# Patient Record
Sex: Male | Born: 1935 | Race: White | Hispanic: No | Marital: Single | Smoking: Former smoker
Health system: Southern US, Community
[De-identification: ages and names within clinical notes are randomized; demographics above are authoritative.]

## PROBLEM LIST (undated history)

## (undated) DIAGNOSIS — M1990 Unspecified osteoarthritis, unspecified site: Secondary | ICD-10-CM

## (undated) DIAGNOSIS — I10 Essential (primary) hypertension: Secondary | ICD-10-CM

## (undated) DIAGNOSIS — D649 Anemia, unspecified: Secondary | ICD-10-CM

## (undated) DIAGNOSIS — C61 Malignant neoplasm of prostate: Secondary | ICD-10-CM

## (undated) DIAGNOSIS — E785 Hyperlipidemia, unspecified: Secondary | ICD-10-CM

## (undated) DIAGNOSIS — C801 Malignant (primary) neoplasm, unspecified: Secondary | ICD-10-CM

## (undated) DIAGNOSIS — R001 Bradycardia, unspecified: Secondary | ICD-10-CM

## (undated) DIAGNOSIS — C50919 Malignant neoplasm of unspecified site of unspecified female breast: Secondary | ICD-10-CM

## (undated) DIAGNOSIS — N189 Chronic kidney disease, unspecified: Secondary | ICD-10-CM

## (undated) DIAGNOSIS — Z923 Personal history of irradiation: Secondary | ICD-10-CM

## (undated) DIAGNOSIS — I2510 Atherosclerotic heart disease of native coronary artery without angina pectoris: Secondary | ICD-10-CM

## (undated) HISTORY — DX: Anemia, unspecified: D64.9

## (undated) HISTORY — PX: MASTECTOMY: SHX3

## (undated) HISTORY — DX: Chronic kidney disease, unspecified: N18.9

## (undated) HISTORY — DX: Essential (primary) hypertension: I10

## (undated) HISTORY — DX: Malignant neoplasm of prostate: C61

## (undated) HISTORY — DX: Hyperlipidemia, unspecified: E78.5

## (undated) HISTORY — DX: Personal history of irradiation: Z92.3

## (undated) HISTORY — DX: Malignant neoplasm of unspecified site of unspecified female breast: C50.919

## (undated) HISTORY — DX: Unspecified osteoarthritis, unspecified site: M19.90

## (undated) HISTORY — DX: Bradycardia, unspecified: R00.1

## (undated) HISTORY — DX: Atherosclerotic heart disease of native coronary artery without angina pectoris: I25.10

---

## 1996-07-08 HISTORY — PX: CATARACT EXTRACTION: SUR2

## 1999-04-18 ENCOUNTER — Other Ambulatory Visit: Admission: RE | Admit: 1999-04-18 | Discharge: 1999-04-18 | Payer: Self-pay

## 2000-01-01 ENCOUNTER — Encounter: Admission: RE | Admit: 2000-01-01 | Discharge: 2000-01-01 | Payer: Self-pay | Admitting: Oncology

## 2000-01-01 ENCOUNTER — Encounter: Payer: Self-pay | Admitting: Oncology

## 2006-07-08 HISTORY — PX: CORONARY STENT PLACEMENT: SHX1402

## 2008-12-08 ENCOUNTER — Ambulatory Visit: Payer: Self-pay | Admitting: Oncology

## 2008-12-12 LAB — COMPREHENSIVE METABOLIC PANEL
Albumin: 3.7 g/dL (ref 3.5–5.2)
BUN: 31 mg/dL — ABNORMAL HIGH (ref 6–23)
Calcium: 9.1 mg/dL (ref 8.4–10.5)
Chloride: 108 mEq/L (ref 96–112)
Creatinine, Ser: 1.38 mg/dL (ref 0.40–1.50)
Glucose, Bld: 100 mg/dL — ABNORMAL HIGH (ref 70–99)
Potassium: 4.7 mEq/L (ref 3.5–5.3)

## 2008-12-12 LAB — CBC WITH DIFFERENTIAL/PLATELET
Basophils Absolute: 0 10*3/uL (ref 0.0–0.1)
Eosinophils Absolute: 0.2 10*3/uL (ref 0.0–0.5)
HCT: 32.5 % — ABNORMAL LOW (ref 38.4–49.9)
HGB: 10.7 g/dL — ABNORMAL LOW (ref 13.0–17.1)
MCH: 30.7 pg (ref 27.2–33.4)
MCV: 93.1 fL (ref 79.3–98.0)
MONO%: 9 % (ref 0.0–14.0)
NEUT#: 5.1 10*3/uL (ref 1.5–6.5)
NEUT%: 66.6 % (ref 39.0–75.0)
RDW: 12.6 % (ref 11.0–14.6)
lymph#: 1.6 10*3/uL (ref 0.9–3.3)

## 2008-12-13 LAB — CANCER ANTIGEN 27.29: CA 27.29: 12 U/mL (ref 0–39)

## 2008-12-15 ENCOUNTER — Ambulatory Visit (HOSPITAL_COMMUNITY): Admission: RE | Admit: 2008-12-15 | Discharge: 2008-12-15 | Payer: Self-pay | Admitting: Oncology

## 2008-12-21 DIAGNOSIS — D649 Anemia, unspecified: Secondary | ICD-10-CM

## 2008-12-21 HISTORY — DX: Anemia, unspecified: D64.9

## 2009-01-12 ENCOUNTER — Ambulatory Visit: Payer: Self-pay | Admitting: Oncology

## 2009-02-13 ENCOUNTER — Ambulatory Visit: Payer: Self-pay | Admitting: Oncology

## 2009-04-13 ENCOUNTER — Ambulatory Visit: Payer: Self-pay | Admitting: Oncology

## 2009-07-14 ENCOUNTER — Ambulatory Visit: Payer: Self-pay | Admitting: Oncology

## 2009-11-22 ENCOUNTER — Ambulatory Visit: Payer: Self-pay | Admitting: Oncology

## 2010-02-28 ENCOUNTER — Ambulatory Visit: Payer: Self-pay | Admitting: Oncology

## 2010-03-02 LAB — COMPREHENSIVE METABOLIC PANEL
ALT: 14 U/L (ref 0–53)
AST: 17 U/L (ref 0–37)
Albumin: 4.1 g/dL (ref 3.5–5.2)
Alkaline Phosphatase: 53 U/L (ref 39–117)
BUN: 40 mg/dL — ABNORMAL HIGH (ref 6–23)
CO2: 25 mEq/L (ref 19–32)
Calcium: 9.3 mg/dL (ref 8.4–10.5)
Chloride: 106 mEq/L (ref 96–112)
Creatinine, Ser: 1.73 mg/dL — ABNORMAL HIGH (ref 0.40–1.50)
Glucose, Bld: 97 mg/dL (ref 70–99)
Potassium: 4.6 mEq/L (ref 3.5–5.3)
Sodium: 140 mEq/L (ref 135–145)
Total Bilirubin: 0.4 mg/dL (ref 0.3–1.2)
Total Protein: 6.6 g/dL (ref 6.0–8.3)

## 2010-03-02 LAB — CBC WITH DIFFERENTIAL/PLATELET
BASO%: 0.7 % (ref 0.0–2.0)
Basophils Absolute: 0 10*3/uL (ref 0.0–0.1)
EOS%: 3.1 % (ref 0.0–7.0)
Eosinophils Absolute: 0.2 10*3/uL (ref 0.0–0.5)
HCT: 32.2 % — ABNORMAL LOW (ref 38.4–49.9)
HGB: 10.6 g/dL — ABNORMAL LOW (ref 13.0–17.1)
LYMPH%: 27.4 % (ref 14.0–49.0)
MCH: 31.5 pg (ref 27.2–33.4)
MCHC: 32.9 g/dL (ref 32.0–36.0)
MCV: 95.5 fL (ref 79.3–98.0)
MONO#: 0.7 10*3/uL (ref 0.1–0.9)
MONO%: 11.2 % (ref 0.0–14.0)
NEUT#: 3.4 10*3/uL (ref 1.5–6.5)
NEUT%: 57.6 % (ref 39.0–75.0)
Platelets: 165 10*3/uL (ref 140–400)
RBC: 3.37 10*6/uL — ABNORMAL LOW (ref 4.20–5.82)
RDW: 12.7 % (ref 11.0–14.6)
WBC: 5.8 10*3/uL (ref 4.0–10.3)
lymph#: 1.6 10*3/uL (ref 0.9–3.3)

## 2010-05-24 ENCOUNTER — Ambulatory Visit: Payer: Self-pay | Admitting: Oncology

## 2010-06-19 ENCOUNTER — Ambulatory Visit (HOSPITAL_COMMUNITY)
Admission: RE | Admit: 2010-06-19 | Discharge: 2010-06-19 | Payer: Self-pay | Source: Home / Self Care | Attending: Oncology | Admitting: Oncology

## 2010-07-29 ENCOUNTER — Encounter: Payer: Self-pay | Admitting: Oncology

## 2010-09-25 ENCOUNTER — Other Ambulatory Visit: Payer: Self-pay | Admitting: Oncology

## 2010-09-25 ENCOUNTER — Encounter (HOSPITAL_BASED_OUTPATIENT_CLINIC_OR_DEPARTMENT_OTHER): Payer: Medicare Other | Admitting: Oncology

## 2010-09-25 DIAGNOSIS — Z853 Personal history of malignant neoplasm of breast: Secondary | ICD-10-CM

## 2010-09-25 DIAGNOSIS — C50929 Malignant neoplasm of unspecified site of unspecified male breast: Secondary | ICD-10-CM

## 2010-09-25 DIAGNOSIS — N189 Chronic kidney disease, unspecified: Secondary | ICD-10-CM

## 2010-10-03 ENCOUNTER — Ambulatory Visit (HOSPITAL_COMMUNITY)
Admission: RE | Admit: 2010-10-03 | Discharge: 2010-10-03 | Disposition: A | Discharge status: Home / Self Care | Payer: Medicare Other | Source: Ambulatory Visit | Attending: Oncology | Admitting: Oncology

## 2010-10-03 ENCOUNTER — Encounter (HOSPITAL_COMMUNITY)
Admission: RE | Admit: 2010-10-03 | Discharge: 2010-10-03 | Disposition: A | Discharge status: Home / Self Care | Payer: Medicare Other | Source: Ambulatory Visit | Attending: Oncology | Admitting: Oncology

## 2010-10-03 ENCOUNTER — Other Ambulatory Visit: Payer: Self-pay | Admitting: Oncology

## 2010-10-03 ENCOUNTER — Encounter (HOSPITAL_COMMUNITY): Payer: Self-pay

## 2010-10-03 ENCOUNTER — Ambulatory Visit (HOSPITAL_COMMUNITY): Payer: BLUE CROSS/BLUE SHIELD

## 2010-10-03 DIAGNOSIS — Z853 Personal history of malignant neoplasm of breast: Secondary | ICD-10-CM | POA: Insufficient documentation

## 2010-10-03 DIAGNOSIS — C50919 Malignant neoplasm of unspecified site of unspecified female breast: Secondary | ICD-10-CM

## 2010-10-03 DIAGNOSIS — IMO0001 Reserved for inherently not codable concepts without codable children: Secondary | ICD-10-CM | POA: Insufficient documentation

## 2010-10-03 DIAGNOSIS — M25559 Pain in unspecified hip: Secondary | ICD-10-CM | POA: Insufficient documentation

## 2010-10-03 DIAGNOSIS — M79609 Pain in unspecified limb: Secondary | ICD-10-CM | POA: Insufficient documentation

## 2010-10-03 DIAGNOSIS — Q762 Congenital spondylolisthesis: Secondary | ICD-10-CM | POA: Insufficient documentation

## 2010-10-03 DIAGNOSIS — M1990 Unspecified osteoarthritis, unspecified site: Secondary | ICD-10-CM | POA: Insufficient documentation

## 2010-10-03 HISTORY — DX: Malignant (primary) neoplasm, unspecified: C80.1

## 2010-10-03 MED ORDER — TECHNETIUM TC 99M MEDRONATE IV KIT
23.1000 | PACK | Freq: Once | INTRAVENOUS | Status: AC | PRN
  Administered 2010-10-03: 23.1 via INTRAVENOUS

## 2010-10-05 ENCOUNTER — Encounter (HOSPITAL_BASED_OUTPATIENT_CLINIC_OR_DEPARTMENT_OTHER): Payer: Medicare Other | Admitting: Oncology

## 2010-10-05 DIAGNOSIS — C50929 Malignant neoplasm of unspecified site of unspecified male breast: Secondary | ICD-10-CM

## 2010-10-05 DIAGNOSIS — N189 Chronic kidney disease, unspecified: Secondary | ICD-10-CM

## 2010-10-15 LAB — GLUCOSE, CAPILLARY: Glucose-Capillary: 103 mg/dL — ABNORMAL HIGH (ref 70–99)

## 2010-11-02 ENCOUNTER — Encounter (HOSPITAL_BASED_OUTPATIENT_CLINIC_OR_DEPARTMENT_OTHER): Payer: Medicare Other | Admitting: Oncology

## 2010-11-02 DIAGNOSIS — Z9010 Acquired absence of unspecified breast and nipple: Secondary | ICD-10-CM

## 2010-11-02 DIAGNOSIS — Z853 Personal history of malignant neoplasm of breast: Secondary | ICD-10-CM

## 2010-11-02 DIAGNOSIS — C771 Secondary and unspecified malignant neoplasm of intrathoracic lymph nodes: Secondary | ICD-10-CM

## 2010-11-02 DIAGNOSIS — C779 Secondary and unspecified malignant neoplasm of lymph node, unspecified: Secondary | ICD-10-CM

## 2010-12-06 ENCOUNTER — Encounter (HOSPITAL_BASED_OUTPATIENT_CLINIC_OR_DEPARTMENT_OTHER): Payer: Medicare Other | Admitting: Oncology

## 2010-12-06 DIAGNOSIS — C771 Secondary and unspecified malignant neoplasm of intrathoracic lymph nodes: Secondary | ICD-10-CM

## 2010-12-06 DIAGNOSIS — C773 Secondary and unspecified malignant neoplasm of axilla and upper limb lymph nodes: Secondary | ICD-10-CM

## 2010-12-06 DIAGNOSIS — Z853 Personal history of malignant neoplasm of breast: Secondary | ICD-10-CM

## 2010-12-06 DIAGNOSIS — C779 Secondary and unspecified malignant neoplasm of lymph node, unspecified: Secondary | ICD-10-CM

## 2011-01-21 ENCOUNTER — Encounter (HOSPITAL_BASED_OUTPATIENT_CLINIC_OR_DEPARTMENT_OTHER): Payer: Medicare Other | Admitting: Oncology

## 2011-01-21 ENCOUNTER — Other Ambulatory Visit: Payer: Self-pay | Admitting: Oncology

## 2011-01-21 DIAGNOSIS — C50929 Malignant neoplasm of unspecified site of unspecified male breast: Secondary | ICD-10-CM

## 2011-01-21 DIAGNOSIS — C773 Secondary and unspecified malignant neoplasm of axilla and upper limb lymph nodes: Secondary | ICD-10-CM

## 2011-01-21 DIAGNOSIS — Z853 Personal history of malignant neoplasm of breast: Secondary | ICD-10-CM

## 2011-01-29 ENCOUNTER — Ambulatory Visit (HOSPITAL_COMMUNITY)
Admission: RE | Admit: 2011-01-29 | Discharge: 2011-01-29 | Disposition: A | Discharge status: Home / Self Care | Payer: Medicare Other | Source: Ambulatory Visit | Attending: Oncology | Admitting: Oncology

## 2011-01-29 DIAGNOSIS — J479 Bronchiectasis, uncomplicated: Secondary | ICD-10-CM | POA: Insufficient documentation

## 2011-01-29 DIAGNOSIS — C50929 Malignant neoplasm of unspecified site of unspecified male breast: Secondary | ICD-10-CM | POA: Insufficient documentation

## 2011-01-29 DIAGNOSIS — J438 Other emphysema: Secondary | ICD-10-CM | POA: Insufficient documentation

## 2011-01-29 DIAGNOSIS — I2510 Atherosclerotic heart disease of native coronary artery without angina pectoris: Secondary | ICD-10-CM | POA: Insufficient documentation

## 2011-01-29 DIAGNOSIS — Z853 Personal history of malignant neoplasm of breast: Secondary | ICD-10-CM

## 2011-01-29 DIAGNOSIS — R0602 Shortness of breath: Secondary | ICD-10-CM | POA: Insufficient documentation

## 2011-01-29 DIAGNOSIS — R911 Solitary pulmonary nodule: Secondary | ICD-10-CM | POA: Insufficient documentation

## 2011-01-29 DIAGNOSIS — Z9010 Acquired absence of unspecified breast and nipple: Secondary | ICD-10-CM | POA: Insufficient documentation

## 2011-01-29 DIAGNOSIS — J984 Other disorders of lung: Secondary | ICD-10-CM | POA: Insufficient documentation

## 2011-01-29 DIAGNOSIS — R05 Cough: Secondary | ICD-10-CM | POA: Insufficient documentation

## 2011-01-29 DIAGNOSIS — R059 Cough, unspecified: Secondary | ICD-10-CM | POA: Insufficient documentation

## 2011-02-06 ENCOUNTER — Other Ambulatory Visit: Payer: Self-pay | Admitting: Oncology

## 2011-02-06 ENCOUNTER — Encounter (HOSPITAL_BASED_OUTPATIENT_CLINIC_OR_DEPARTMENT_OTHER): Payer: Medicare Other | Admitting: Oncology

## 2011-02-06 DIAGNOSIS — Z853 Personal history of malignant neoplasm of breast: Secondary | ICD-10-CM

## 2011-02-06 DIAGNOSIS — C771 Secondary and unspecified malignant neoplasm of intrathoracic lymph nodes: Secondary | ICD-10-CM

## 2011-02-06 DIAGNOSIS — C779 Secondary and unspecified malignant neoplasm of lymph node, unspecified: Secondary | ICD-10-CM

## 2011-02-06 DIAGNOSIS — C773 Secondary and unspecified malignant neoplasm of axilla and upper limb lymph nodes: Secondary | ICD-10-CM

## 2011-02-06 DIAGNOSIS — C50919 Malignant neoplasm of unspecified site of unspecified female breast: Secondary | ICD-10-CM

## 2011-02-06 DIAGNOSIS — C50929 Malignant neoplasm of unspecified site of unspecified male breast: Secondary | ICD-10-CM

## 2011-02-06 LAB — CBC WITH DIFFERENTIAL/PLATELET
BASO%: 0.2 % (ref 0.0–2.0)
EOS%: 5.8 % (ref 0.0–7.0)
HCT: 31.9 % — ABNORMAL LOW (ref 38.4–49.9)
MCH: 32.5 pg (ref 27.2–33.4)
MCHC: 34.6 g/dL (ref 32.0–36.0)
MONO#: 0.7 10*3/uL (ref 0.1–0.9)
NEUT%: 55.3 % (ref 39.0–75.0)
RBC: 3.4 10*6/uL — ABNORMAL LOW (ref 4.20–5.82)
RDW: 13.2 % (ref 11.0–14.6)
WBC: 6.4 10*3/uL (ref 4.0–10.3)
lymph#: 1.8 10*3/uL (ref 0.9–3.3)

## 2011-02-06 LAB — BASIC METABOLIC PANEL
CO2: 21 mEq/L (ref 19–32)
Chloride: 109 mEq/L (ref 96–112)
Creatinine, Ser: 1.49 mg/dL — ABNORMAL HIGH (ref 0.50–1.35)
Potassium: 4 mEq/L (ref 3.5–5.3)
Sodium: 140 mEq/L (ref 135–145)

## 2011-04-05 ENCOUNTER — Other Ambulatory Visit: Payer: Self-pay | Admitting: Oncology

## 2011-04-05 ENCOUNTER — Encounter (HOSPITAL_BASED_OUTPATIENT_CLINIC_OR_DEPARTMENT_OTHER): Payer: Medicare Other | Admitting: Oncology

## 2011-04-05 DIAGNOSIS — Z853 Personal history of malignant neoplasm of breast: Secondary | ICD-10-CM

## 2011-04-05 DIAGNOSIS — C773 Secondary and unspecified malignant neoplasm of axilla and upper limb lymph nodes: Secondary | ICD-10-CM

## 2011-04-05 DIAGNOSIS — C771 Secondary and unspecified malignant neoplasm of intrathoracic lymph nodes: Secondary | ICD-10-CM

## 2011-04-05 DIAGNOSIS — C50919 Malignant neoplasm of unspecified site of unspecified female breast: Secondary | ICD-10-CM

## 2011-04-05 LAB — CBC WITH DIFFERENTIAL/PLATELET
BASO%: 0.4 % (ref 0.0–2.0)
MCHC: 34.3 g/dL (ref 32.0–36.0)
MONO#: 0.8 10*3/uL (ref 0.1–0.9)
NEUT#: 4.4 10*3/uL (ref 1.5–6.5)
RBC: 3.53 10*6/uL — ABNORMAL LOW (ref 4.20–5.82)
RDW: 13.1 % (ref 11.0–14.6)
WBC: 7.6 10*3/uL (ref 4.0–10.3)
lymph#: 2.1 10*3/uL (ref 0.9–3.3)

## 2011-06-28 ENCOUNTER — Telehealth: Payer: Self-pay | Admitting: Oncology

## 2011-06-28 ENCOUNTER — Ambulatory Visit (HOSPITAL_BASED_OUTPATIENT_CLINIC_OR_DEPARTMENT_OTHER): Payer: Medicare Other | Admitting: Oncology

## 2011-06-28 VITALS — BP 159/70 | HR 54 | Temp 98.0°F | Wt 140.1 lb

## 2011-06-28 DIAGNOSIS — Z170 Estrogen receptor positive status [ER+]: Secondary | ICD-10-CM

## 2011-06-28 DIAGNOSIS — C779 Secondary and unspecified malignant neoplasm of lymph node, unspecified: Secondary | ICD-10-CM

## 2011-06-28 DIAGNOSIS — C771 Secondary and unspecified malignant neoplasm of intrathoracic lymph nodes: Secondary | ICD-10-CM

## 2011-06-28 DIAGNOSIS — C50919 Malignant neoplasm of unspecified site of unspecified female breast: Secondary | ICD-10-CM

## 2011-06-28 DIAGNOSIS — C50929 Malignant neoplasm of unspecified site of unspecified male breast: Secondary | ICD-10-CM

## 2011-06-28 NOTE — Telephone Encounter (Signed)
appt made and printed for pt for 1/25 and rad onc     aom

## 2011-06-28 NOTE — Progress Notes (Signed)
OFFICE PROGRESS NOTE   INTERVAL HISTORY:   He returns as scheduled. He has no new complaint. He reports stable exertional dyspnea. He continues Megace. The right axillary/chest wall mass is "coming out of the skin".  Objective:  Vital signs in last 24 hours:  Blood pressure 159/70, pulse 54, temperature 98 F (36.7 C), temperature source Oral, weight 140 lb 1.6 oz (63.549 kg).    HEENT: Neck without mass Lymphatics: No cervical, clavicular, or axillary nodes Resp: Inspiratory rhonchi at the low posterior chest bilaterally. No respiratory distress Cardio: Regular rate and rhythm, bradycardia GI: No hepatomegaly Vascular: Trace edema at the low leg bilaterally  Muscle skeletal: There is a 2 cm cutaneous mass at the right axillary line. The mass appears to be slightly larger and there is now overlying erythema. No ulceration. Superior to the mass and along the axillary line there is a 2 mm cutaneous lesion     Lab Results:  Lab Results  Component Value Date   WBC 7.6 04/05/2011   HGB 11.5* 04/05/2011   HCT 33.4* 04/05/2011   MCV 94.5 04/05/2011   PLT 211 04/05/2011      Medications: I have reviewed the patient's current medications.  Assessment/Plan: 1. Metastatic breast cancer involving a right axillary subcutaneous mass and mediastinal lymphadenopathy. A fine-needle aspiration of the mass was consistent with metastatic breast cancer. The tumor was ER/PR positive. A staging PET scan revealed hypermetabolic lymph nodes in the right axilla and mediastinum.  -Arimidex was initiated after an office visit 12/11/2008.  -A. restaging CT on 06/19/2010 revealed a slight decrease the right axillary lymphadenopathy compared to the PET scan from June of 2000 and and no evidence for progressive metastatic disease.   -The right-sided mass appeared larger with satellite lesions on exam 10/05/2010. Megace was initiated on 10/05/2010 to  -A. restaging CT 01/29/2011 showed stable right axillary  lymphadenopathy  2. Remote history of bilateral breast cancer, diagnosis of multinode positive right-sided breast cancer in 1996, status with adjuvant CMF chemotherapy and tamoxifen. Muscles completed in 2001. Status post bilateral mastectomies.  3. History of coronary artery  4. Hypertension  5. Hyperlipidemia  6. Mild anemia a CBC 12/21/2008. The hemoglobin has remained stable  7. Chronic renal insufficiency  8. Lipoma of the left arm  9. History of arthralgias secondary to degenerative arthritis  10. Chronic bradycardia  11. Left leg pain. A plain x-ray evaluation of the lumbosacral spine left leg was negative for evidence of metastatic disease in March of 2012. A bone scan was also negative. The pain resolved.     Disposition:  He appears stable. The cutaneous mass at the right axillary line appears slightly larger and there may be early skin involvement. The mass has not decreased in size while on Megace. There may be a satellite lesion along the pectoral line. We decided to continue Megace for now. We will make a referral to radiation oncology to consider palliative radiation to the cutaneous mass. Mr. Nicholson will return for an office visit in 4-6 weeks. There is no additional evidence of disease progression.   Lucile Shutters, MD  06/28/2011  3:39 PM

## 2011-07-05 ENCOUNTER — Encounter: Payer: Self-pay | Admitting: *Deleted

## 2011-07-05 DIAGNOSIS — D179 Benign lipomatous neoplasm, unspecified: Secondary | ICD-10-CM | POA: Insufficient documentation

## 2011-07-05 DIAGNOSIS — M1990 Unspecified osteoarthritis, unspecified site: Secondary | ICD-10-CM | POA: Insufficient documentation

## 2011-07-05 DIAGNOSIS — Z853 Personal history of malignant neoplasm of breast: Secondary | ICD-10-CM | POA: Insufficient documentation

## 2011-07-05 DIAGNOSIS — I2510 Atherosclerotic heart disease of native coronary artery without angina pectoris: Secondary | ICD-10-CM | POA: Insufficient documentation

## 2011-07-05 DIAGNOSIS — I10 Essential (primary) hypertension: Secondary | ICD-10-CM | POA: Insufficient documentation

## 2011-07-05 DIAGNOSIS — R001 Bradycardia, unspecified: Secondary | ICD-10-CM | POA: Insufficient documentation

## 2011-07-05 DIAGNOSIS — C61 Malignant neoplasm of prostate: Secondary | ICD-10-CM | POA: Insufficient documentation

## 2011-07-05 DIAGNOSIS — E785 Hyperlipidemia, unspecified: Secondary | ICD-10-CM | POA: Insufficient documentation

## 2011-07-05 NOTE — Progress Notes (Signed)
11/30/2008 R axillary nodule bx: adenocarcinoma  Remote hx R breast cancer 1996, s/p R mod mastectomy, 4/10 nodes pos., adjuvant CMF chemo, Tamoxifen completed 2001  1997 L breast cancer, mastectomy, 0/18 nodes pos   Married, wife has Alzheimer's, retired from [employer] work

## 2011-07-10 ENCOUNTER — Ambulatory Visit
Admission: RE | Admit: 2011-07-10 | Discharge: 2011-07-10 | Disposition: A | Discharge status: Home / Self Care | Payer: Medicare Other | Source: Ambulatory Visit | Attending: Radiation Oncology | Admitting: Radiation Oncology

## 2011-07-10 ENCOUNTER — Encounter: Payer: Self-pay | Admitting: Radiation Oncology

## 2011-07-10 VITALS — BP 145/72 | HR 51 | Temp 97.9°F | Resp 20 | Ht 70.5 in | Wt 144.2 lb

## 2011-07-10 DIAGNOSIS — C50919 Malignant neoplasm of unspecified site of unspecified female breast: Secondary | ICD-10-CM

## 2011-07-10 DIAGNOSIS — Z7982 Long term (current) use of aspirin: Secondary | ICD-10-CM | POA: Insufficient documentation

## 2011-07-10 DIAGNOSIS — Z8042 Family history of malignant neoplasm of prostate: Secondary | ICD-10-CM | POA: Insufficient documentation

## 2011-07-10 DIAGNOSIS — Z87891 Personal history of nicotine dependence: Secondary | ICD-10-CM | POA: Insufficient documentation

## 2011-07-10 DIAGNOSIS — Z9221 Personal history of antineoplastic chemotherapy: Secondary | ICD-10-CM | POA: Insufficient documentation

## 2011-07-10 DIAGNOSIS — C773 Secondary and unspecified malignant neoplasm of axilla and upper limb lymph nodes: Secondary | ICD-10-CM

## 2011-07-10 DIAGNOSIS — Z9010 Acquired absence of unspecified breast and nipple: Secondary | ICD-10-CM | POA: Insufficient documentation

## 2011-07-10 DIAGNOSIS — C50929 Malignant neoplasm of unspecified site of unspecified male breast: Secondary | ICD-10-CM | POA: Insufficient documentation

## 2011-07-10 DIAGNOSIS — Z803 Family history of malignant neoplasm of breast: Secondary | ICD-10-CM | POA: Insufficient documentation

## 2011-07-10 DIAGNOSIS — Z809 Family history of malignant neoplasm, unspecified: Secondary | ICD-10-CM | POA: Insufficient documentation

## 2011-07-10 DIAGNOSIS — Z510 Encounter for antineoplastic radiation therapy: Secondary | ICD-10-CM | POA: Insufficient documentation

## 2011-07-10 DIAGNOSIS — Z79899 Other long term (current) drug therapy: Secondary | ICD-10-CM | POA: Insufficient documentation

## 2011-07-10 NOTE — Progress Notes (Signed)
Please see the Nurse Progress Note in the MD Initial Consult Encounter for this patient. 

## 2011-07-10 NOTE — Progress Notes (Signed)
PCP Dr Tresa Endo, Kathryne Sharper

## 2011-07-11 ENCOUNTER — Encounter: Payer: Self-pay | Admitting: Radiation Oncology

## 2011-07-11 DIAGNOSIS — C773 Secondary and unspecified malignant neoplasm of axilla and upper limb lymph nodes: Secondary | ICD-10-CM | POA: Diagnosis present

## 2011-07-11 NOTE — Progress Notes (Signed)
CC:   Ladene Artist, M.D.  REFERRING PHYSICIAN:  Ladene Artist, M.D.  DIAGNOSIS:  Metastatic breast cancer.  HISTORY OF PRESENT ILLNESS:  The patient is an 78 year old male who has a history of bilateral breast cancer now metastatic.  The patient was initially diagnosed with multi-node positive right-sided breast cancer in 1996 and has undergone CMF chemotherapy and tamoxifen.  He is status post bilateral mastectomies and has received additional hormonal treatment, including Arimidex and most recently he has been on Megace. The patient states that he has had some right-sided axillary lymphadenopathy for a couple of years.  He does not really know if this has grown very much since that time, but it does appear more prominent and he is describing some soreness in this area as well as being more prominent in terms of possible skin involvement.  Given these findings Dr. Truett Perna has asked me to see Mr. Barish today for consideration of a possible course of palliative radiation to the right axilla.  The patient denies any other known areas of discomfort or similar issues elsewhere.  The patient's last imaging includes a bone scan on 10/03/2010 which did not reveal any evidence of bony metastases.  A PET scan in June of 2010 showed some enlarged or hypermetabolic right axillary lymph nodes consistent with hypermetabolic tumor with low level nonspecific FDG uptake associated with some mediastinal lymph nodes. His last CT scan was completed on 01/29/2011 involving a CT scan of the chest.  This showed a similar size in appearance of the right axillary node and adjacent subcutaneous region of soft-tissue density along the lateral pectoralis musculature, which was compared multiple our exam in 2011.  CURRENT MEDICATIONS:  Norvasc, aspirin, Crestor, cyanocobalamin, Megace.  ALLERGIES:  No known drug allergies.  FAMILY HISTORY:  The patient indicates his father suffered from  prostate cancer and his sister suffered from breast cancer.  He indicates his mother had an unknown type of cancer.  SOCIAL HISTORY:  The patient lives in [place].  He is a widow and has 2 sons.  He is a former smoker and quit in 1982.  REVIEW OF SYSTEMS:  Fully reviewed and documented in the medical chart.  PHYSICAL EXAMINATION:  Weight 144 pounds.  Blood pressure 145/72, pulse 51, respiratory rate 20, temperature 97.9.  General:  Well-developed male in no acute distress.  Alert and oriented x3.  HEENT: Normocephalic, atraumatic.  Pupils equal, round, reactive to light. Extraocular movements intact.  Oral cavity clear.  Neck:  Supple without any cervical lymphadenopathy.  Cardiovascular:  Regular rate and rhythm. Respiratory: Clear to auscultation.  The patient has a subcutaneous nodule present within the right axilla with a little bit of overlying erythema and some thickening of skin at this site.  No axillary adenopathy on the left.  GI: Abdomen soft, nontender.  Normal bowel sounds.  Extremities:  No edema present.  Neurologic:  No focal deficits.  IMPRESSION AND PLAN:  Mr. Beveridge is an 78 year old male with a diagnosis of metastatic breast cancer.  He has had right axillary lymph node recurrence for a couple of years with the mass appearing larger with some satellite lesions on exam in March of 2012.  He has been on Megace since March of 2012.  This area has become more prominent with some more skin changes it appears and I do believe that the patient therefore is an appropriate candidate for a course of palliative radiotherapy to the involved region within the right axilla.  I have  discussed the possible benefits of such a treatment with the patient in addition to the risks and side effects.  All of his questions were answered and he indicated that he did wish to proceed with this treatment.  Therefore, we will schedule Mr. Siemen  for a simulation when this can be arranged  within the next week or so and I will proceed with treatment planning.  I would anticipate an approximate 3 week course of treatment.    ______________________________ Radene Gunning, M.D., Ph.D. JSM/MEDQ  D:  07/11/2011  T:  07/11/2011  Job:  [id number]

## 2011-07-15 NOTE — Progress Notes (Signed)
Encounter addended by: Glennie Hawk, RN on: 07/15/2011  1:52 PM<BR>     Documentation filed: Charges VN

## 2011-07-17 ENCOUNTER — Ambulatory Visit
Admission: RE | Admit: 2011-07-17 | Discharge: 2011-07-17 | Disposition: A | Discharge status: Home / Self Care | Payer: Medicare Other | Source: Ambulatory Visit | Attending: Radiation Oncology | Admitting: Radiation Oncology

## 2011-07-17 DIAGNOSIS — C773 Secondary and unspecified malignant neoplasm of axilla and upper limb lymph nodes: Secondary | ICD-10-CM

## 2011-07-17 NOTE — Progress Notes (Signed)
Met with patient to discuss RO billing.   Dx: 175.9 Male Breast, NOS (excludes Skin of breast T-173.5) Attending Rad: Dr. Mitzi Hansen  Rad Tx: ([id number] Extrl Beam)

## 2011-07-24 ENCOUNTER — Ambulatory Visit
Admission: RE | Admit: 2011-07-24 | Discharge: 2011-07-24 | Disposition: A | Discharge status: Home / Self Care | Payer: Medicare Other | Source: Ambulatory Visit | Attending: Radiation Oncology | Admitting: Radiation Oncology

## 2011-07-24 DIAGNOSIS — C773 Secondary and unspecified malignant neoplasm of axilla and upper limb lymph nodes: Secondary | ICD-10-CM

## 2011-07-25 ENCOUNTER — Ambulatory Visit
Admission: RE | Admit: 2011-07-25 | Discharge: 2011-07-25 | Disposition: A | Discharge status: Home / Self Care | Payer: Medicare Other | Source: Ambulatory Visit | Attending: Radiation Oncology | Admitting: Radiation Oncology

## 2011-07-25 ENCOUNTER — Encounter: Payer: Self-pay | Admitting: Radiation Oncology

## 2011-07-25 ENCOUNTER — Ambulatory Visit: Payer: Medicare Other

## 2011-07-25 VITALS — Wt 143.0 lb

## 2011-07-25 DIAGNOSIS — C773 Secondary and unspecified malignant neoplasm of axilla and upper limb lymph nodes: Secondary | ICD-10-CM

## 2011-07-25 DIAGNOSIS — C50919 Malignant neoplasm of unspecified site of unspecified female breast: Secondary | ICD-10-CM

## 2011-07-25 MED ORDER — RADIAPLEXRX EX GEL
Freq: Once | CUTANEOUS | Status: AC
  Administered 2011-07-25: 11:00:00 via TOPICAL

## 2011-07-25 NOTE — Progress Notes (Signed)
Encounter addended by: Jonna Coup, MD on: 07/25/2011 11:02 AM<BR>     Documentation filed: Visit Diagnoses

## 2011-07-25 NOTE — Progress Notes (Signed)
Post sim done, gave pt "Radiation and You" booklet. All questions answered; referred pt to Dr Mitzi Hansen re: should he cont taking Megestrol. Pt wil use [facility].

## 2011-07-25 NOTE — Progress Notes (Signed)
[facility] Radiation Oncology Weekly Treatment Note    Name: Derek Flores Date: 07/25/2011 MRN: [id number] DOB: 1920-12-03  Status: outpatient    Current dose: 250  Current fraction:1  Planned dose:4500  Planned fraction:18   MEDICATIONS: Current Outpatient Prescriptions  Medication Sig Dispense Refill  . amLODipine (NORVASC) 5 MG tablet daily.       Marland Kitchen aspirin 81 MG tablet Take 81 mg by mouth daily.        . CRESTOR 20 MG tablet       . cyanocobalamin 1000 MCG tablet Take 100 mcg by mouth daily.        . megestrol (MEGACE) 40 MG tablet 80 mg 2 (two) times daily.        Current Facility-Administered Medications  Medication Dose Route Frequency Provider Last Rate Last Dose  . hyaluronate sodium (RADIAPLEXRX) gel   Topical Once Jonna Coup, MD         ALLERGIES: Review of patient's allergies indicates no known allergies.   LABORATORY DATA:  Lab Results  Component Value Date   WBC 7.6 04/05/2011   HGB 11.5* 04/05/2011   HCT 33.4* 04/05/2011   MCV 94.5 04/05/2011   PLT 211 04/05/2011   Lab Results  Component Value Date   NA 140 02/06/2011   K 4.0 02/06/2011   CL 109 02/06/2011   CO2 21 02/06/2011   Lab Results  Component Value Date   ALT 14 03/02/2010   AST 17 03/02/2010   ALKPHOS 53 03/02/2010   BILITOT 0.4 03/02/2010      NARRATIVE: Derek Flores was seen today for weekly treatment management. The chart was checked and port films images were reviewed. Pt has begun treatment. Doing well. No complaints.  PHYSICAL EXAMINATION: weight is 143 lb (64.864 kg).       ASSESSMENT: Patient tolerating treatments well.    PLAN: Continue treatment as planned. Pt to begin radioplex.

## 2011-07-26 ENCOUNTER — Ambulatory Visit
Admission: RE | Admit: 2011-07-26 | Discharge: 2011-07-26 | Disposition: A | Discharge status: Home / Self Care | Payer: Medicare Other | Source: Ambulatory Visit | Attending: Radiation Oncology | Admitting: Radiation Oncology

## 2011-07-26 ENCOUNTER — Telehealth: Payer: Self-pay | Admitting: Oncology

## 2011-07-26 NOTE — Telephone Encounter (Signed)
Pt came by the office to r/s 1/25 appt with gbs as his rad onc appt is at the same time.  R/s gbs to 1/28,printed for pt.   aom

## 2011-07-26 NOTE — Progress Notes (Signed)
DIAGNOSIS:  Metastatic breast cancer.  NARRATIVE:  Derek Flores presented for his first fraction of radiation treatment to the right axilla.  He will undergo this treatment with 0.5 cm of bolus to ensure adequate dose to the skin.  Therefore, a piece of bolus was fashioned with this depth characteristic.  This will represent a simple treatment device which will be utilized on a daily basis during his treatment.    ______________________________ Radene Gunning, M.D., Ph.D. JSM/MEDQ  D:  07/25/2011  T:  07/26/2011  Job:  [id number]

## 2011-07-26 NOTE — Procedures (Signed)
DIAGNOSIS:  Metastatic breast cancer.  NARRATIVE:  Mr. Batson presented for port films prior to beginning his course of radiation treatment.  The isocenter was accurately placed, and the blocks appropriately delineate the target treatment region. Therefore, we will proceed with his treatment as planned.    ______________________________ Radene Gunning, M.D., Ph.D. JSM/MEDQ  D:  07/25/2011  T:  07/26/2011  Job:  [id number]

## 2011-07-26 NOTE — Progress Notes (Signed)
DIAGNOSIS:  Metastatic breast cancer.  NARRATIVE:  Mr. Musial presented for simulation for his upcoming course of radiation to the right axilla.  The patient was placed in a supine position, and his arms were raised above him using a customized wing board device.  This customized block will be used on a daily basis during the course of his treatment.  In this fashion, a CT scan was obtained through the chest region, and an isocenter was placed within the right axilla.  Mr. Vandenberghe will receive a course of radiation corresponding to 45 Gy in 18 fractions at 2.5 Gy per fraction to the right axilla.  The palpable gross tumor volume has been contoured, and this was also outlined with a wire in the involved area.  The patient's treatment will consist of 2 fields, and therefore 2 customized blocks have been designed for the patient's treatment.  A complex isodose plan is requested for review to ensure adequate dose to the target region, and it is anticipated that bolus will be necessary to achieve this.    ______________________________ Radene Gunning, M.D., Ph.D. JSM/MEDQ  D:  07/25/2011  T:  07/26/2011  Job:  [id number]

## 2011-07-29 ENCOUNTER — Ambulatory Visit
Admission: RE | Admit: 2011-07-29 | Discharge: 2011-07-29 | Disposition: A | Discharge status: Home / Self Care | Payer: Medicare Other | Source: Ambulatory Visit | Attending: Radiation Oncology | Admitting: Radiation Oncology

## 2011-07-30 ENCOUNTER — Ambulatory Visit
Admission: RE | Admit: 2011-07-30 | Discharge: 2011-07-30 | Disposition: A | Discharge status: Home / Self Care | Payer: Medicare Other | Source: Ambulatory Visit | Attending: Radiation Oncology | Admitting: Radiation Oncology

## 2011-07-30 ENCOUNTER — Telehealth: Payer: Self-pay | Admitting: *Deleted

## 2011-07-30 NOTE — Telephone Encounter (Signed)
Requesting Dr. Truett Perna call him with update on his current disease status and treatment and expectations of treatment. Patient does not seem to really be sure of specifics. Forwarded request to MD

## 2011-07-31 ENCOUNTER — Ambulatory Visit
Admission: RE | Admit: 2011-07-31 | Discharge: 2011-07-31 | Disposition: A | Discharge status: Home / Self Care | Payer: Medicare Other | Source: Ambulatory Visit | Attending: Radiation Oncology | Admitting: Radiation Oncology

## 2011-08-01 ENCOUNTER — Encounter: Payer: Self-pay | Admitting: Radiation Oncology

## 2011-08-01 ENCOUNTER — Ambulatory Visit
Admission: RE | Admit: 2011-08-01 | Discharge: 2011-08-01 | Disposition: A | Discharge status: Home / Self Care | Payer: Medicare Other | Source: Ambulatory Visit | Attending: Radiation Oncology | Admitting: Radiation Oncology

## 2011-08-01 DIAGNOSIS — C773 Secondary and unspecified malignant neoplasm of axilla and upper limb lymph nodes: Secondary | ICD-10-CM

## 2011-08-01 NOTE — Progress Notes (Signed)
[facility] Radiation Oncology Weekly Treatment Note    Name: Derek Flores Date: 08/01/2011 MRN: [id number] DOB: 11-12-1920  Status: outpatient    Current dose: 1500  Current fraction:6  Planned dose:4500  Planned fraction:18   MEDICATIONS: Current Outpatient Prescriptions  Medication Sig Dispense Refill  . amLODipine (NORVASC) 5 MG tablet daily.       Marland Kitchen aspirin 81 MG tablet Take 81 mg by mouth daily.        . CRESTOR 20 MG tablet       . cyanocobalamin 1000 MCG tablet Take 100 mcg by mouth daily.        . megestrol (MEGACE) 40 MG tablet 80 mg 2 (two) times daily.          ALLERGIES: Review of patient's allergies indicates no known allergies.   LABORATORY DATA:  Lab Results  Component Value Date   WBC 7.6 04/05/2011   HGB 11.5* 04/05/2011   HCT 33.4* 04/05/2011   MCV 94.5 04/05/2011   PLT 211 04/05/2011   Lab Results  Component Value Date   NA 140 02/06/2011   K 4.0 02/06/2011   CL 109 02/06/2011   CO2 21 02/06/2011   Lab Results  Component Value Date   ALT 14 03/02/2010   AST 17 03/02/2010   ALKPHOS 53 03/02/2010   BILITOT 0.4 03/02/2010      NARRATIVE: Derek Flores was seen today for weekly treatment management. The chart was checked and port films images were reviewed. Pt is doing well. No complaints.  PHYSICAL EXAMINATION: weight is 144 lb (65.318 kg).    Minimal skin change   ASSESSMENT: Patient tolerating treatments well.    PLAN: Continue treatment as planned.

## 2011-08-01 NOTE — Progress Notes (Signed)
Pt has no c/o today 

## 2011-08-02 ENCOUNTER — Ambulatory Visit
Admission: RE | Admit: 2011-08-02 | Discharge: 2011-08-02 | Disposition: A | Discharge status: Home / Self Care | Payer: Medicare Other | Source: Ambulatory Visit | Attending: Radiation Oncology | Admitting: Radiation Oncology

## 2011-08-02 ENCOUNTER — Ambulatory Visit: Payer: Medicare Other | Admitting: Oncology

## 2011-08-05 ENCOUNTER — Ambulatory Visit (HOSPITAL_BASED_OUTPATIENT_CLINIC_OR_DEPARTMENT_OTHER): Payer: Medicare Other | Admitting: Oncology

## 2011-08-05 ENCOUNTER — Telehealth: Payer: Self-pay | Admitting: Oncology

## 2011-08-05 ENCOUNTER — Ambulatory Visit
Admission: RE | Admit: 2011-08-05 | Discharge: 2011-08-05 | Disposition: A | Discharge status: Home / Self Care | Payer: Medicare Other | Source: Ambulatory Visit | Attending: Radiation Oncology | Admitting: Radiation Oncology

## 2011-08-05 VITALS — BP 164/72 | HR 48 | Temp 96.8°F | Ht 70.5 in | Wt 140.7 lb

## 2011-08-05 DIAGNOSIS — C50919 Malignant neoplasm of unspecified site of unspecified female breast: Secondary | ICD-10-CM

## 2011-08-05 DIAGNOSIS — C50929 Malignant neoplasm of unspecified site of unspecified male breast: Secondary | ICD-10-CM

## 2011-08-05 NOTE — Telephone Encounter (Signed)
appt made and printed for 3/12 12;30 per gbs     aom

## 2011-08-05 NOTE — Progress Notes (Signed)
OFFICE PROGRESS NOTE   INTERVAL HISTORY:   He returns as scheduled. He is completing definitive radiation to the right chest wall. He reports tolerating the radiation well. He has no new complaint. He is taking Megace  Objective:  Vital signs in last 24 hours:  Blood pressure 164/72, pulse 48, temperature 96.8 F (36 C), temperature source Oral, height 5' 10.5" (1.791 m), weight 140 lb 11.2 oz (63.821 kg).    HEENT: Neck without mass Lymphatics: No cervical, supraclavicular, or axillary nodes Resp: Inspiratory rhonchi at the posterior base bilaterally, no respiratory distress Cardio: Regular rate and rhythm GI: No hepatomegaly Vascular: No leg edema  Skin: mild radiation erythema at the right anterolateral chest wall  Musculoskeletal: There is an approximate 2 cm cutaneous mass at the right lateral chest wall near the axillary line. No discrete satellite nodule.   Medications: I have reviewed the patient's current medications.  Assessment/Plan: 1. Metastatic breast cancer involving a right axillary subcutaneous mass and mediastinal lymphadenopathy. A fine-needle aspiration of the mass was consistent with metastatic breast cancer. The tumor was ER/PR positive. A staging PET scan revealed hypermetabolic lymph nodes in the right axilla and mediastinum.  -Arimidex was initiated after an office visit 12/11/2008.  -A. restaging CT on 06/19/2010 revealed a slight decrease the right axillary lymphadenopathy compared to the PET scan from June of 2000 and and no evidence for progressive metastatic disease.  -The right-sided mass appeared larger with satellite lesions on exam 10/05/2010. Megace was initiated on 10/05/2010. -A. restaging CT 01/29/2011 showed stable right axillary lymphadenopathy           -he began palliative radiation to the right axillary line mass on 07/25/2011.         2. Remote history of bilateral breast cancer, diagnosis of multinode positive right-sided breast cancer in  1996, status with adjuvant CMF chemotherapy and tamoxifen. Muscles completed in 2001. Status post bilateral mastectomies.  3. History of coronary artery  4. Hypertension  5. Hyperlipidemia  6. Mild anemia a CBC 12/21/2008. The hemoglobin has remained stable  7. Chronic renal insufficiency  8. Lipoma of the left arm  9. History of arthralgias secondary to degenerative arthritis  10. Chronic bradycardia  11. Left leg pain. A plain x-ray evaluation of the lumbosacral spine left leg was negative for evidence of metastatic disease in March of 2012. A bone scan was also negative. The pain resolved.    Disposition:  He appears stable. He will continue Megace. He will complete the radiation over the next 2 weeks. The chest wall mass appears slightly smaller on exam today. Mr. Clinard will return for an office visit approximately 4 weeks after completing radiation.   Lucile Shutters, MD  08/05/2011  2:11 PM

## 2011-08-06 ENCOUNTER — Ambulatory Visit
Admission: RE | Admit: 2011-08-06 | Discharge: 2011-08-06 | Disposition: A | Discharge status: Home / Self Care | Payer: Medicare Other | Source: Ambulatory Visit | Attending: Radiation Oncology | Admitting: Radiation Oncology

## 2011-08-07 ENCOUNTER — Ambulatory Visit
Admission: RE | Admit: 2011-08-07 | Discharge: 2011-08-07 | Disposition: A | Discharge status: Home / Self Care | Payer: Medicare Other | Source: Ambulatory Visit | Attending: Radiation Oncology | Admitting: Radiation Oncology

## 2011-08-08 ENCOUNTER — Encounter: Payer: Self-pay | Admitting: Radiation Oncology

## 2011-08-08 ENCOUNTER — Ambulatory Visit
Admission: RE | Admit: 2011-08-08 | Discharge: 2011-08-08 | Disposition: A | Discharge status: Home / Self Care | Payer: Medicare Other | Source: Ambulatory Visit | Attending: Radiation Oncology | Admitting: Radiation Oncology

## 2011-08-08 DIAGNOSIS — C773 Secondary and unspecified malignant neoplasm of axilla and upper limb lymph nodes: Secondary | ICD-10-CM

## 2011-08-08 NOTE — Progress Notes (Signed)
Pt has no c/o today 

## 2011-08-08 NOTE — Progress Notes (Signed)
[facility] Radiation Oncology Weekly Treatment Note    Name: Derek Flores Date: 08/08/2011 MRN: [id number] DOB: [birth year]  Status: outpatient    Current dose: 2750  Current fraction: 11  Planned dose:4500  Planned fraction:18   MEDICATIONS: Current Outpatient Prescriptions  Medication Sig Dispense Refill  . amLODipine (NORVASC) 5 MG tablet daily.       Marland Kitchen aspirin 81 MG tablet Take 81 mg by mouth daily.        . CRESTOR 20 MG tablet Take 20 mg by mouth daily.       . cyanocobalamin 1000 MCG tablet Take 100 mcg by mouth daily.        . megestrol (MEGACE) 40 MG tablet 80 mg 2 (two) times daily.          ALLERGIES: Review of patient's allergies indicates no known allergies.   LABORATORY DATA:  Lab Results  Component Value Date   WBC 7.6 04/05/2011   HGB 11.5* 04/05/2011   HCT 33.4* 04/05/2011   MCV 94.5 04/05/2011   PLT 211 04/05/2011   Lab Results  Component Value Date   NA 140 02/06/2011   K 4.0 02/06/2011   CL 109 02/06/2011   CO2 21 02/06/2011   Lab Results  Component Value Date   ALT 14 03/02/2010   AST 17 03/02/2010   ALKPHOS 53 03/02/2010   BILITOT 0.4 03/02/2010      NARRATIVE: Derek Flores was seen today for weekly treatment management. The chart was checked and port films images were reviewed. Pt doing well. No complaints.  PHYSICAL EXAMINATION: weight is 145 lb (65.772 kg).     Skin looks good - mild erythema/ darkening  ASSESSMENT: Patient tolerating treatments well.    PLAN: Continue treatment as planned.

## 2011-08-09 ENCOUNTER — Ambulatory Visit
Admission: RE | Admit: 2011-08-09 | Discharge: 2011-08-09 | Disposition: A | Discharge status: Home / Self Care | Payer: Medicare Other | Source: Ambulatory Visit | Attending: Radiation Oncology | Admitting: Radiation Oncology

## 2011-08-12 ENCOUNTER — Ambulatory Visit
Admission: RE | Admit: 2011-08-12 | Discharge: 2011-08-12 | Disposition: A | Discharge status: Home / Self Care | Payer: Medicare Other | Source: Ambulatory Visit | Attending: Radiation Oncology | Admitting: Radiation Oncology

## 2011-08-13 ENCOUNTER — Ambulatory Visit
Admission: RE | Admit: 2011-08-13 | Discharge: 2011-08-13 | Disposition: A | Discharge status: Home / Self Care | Payer: Medicare Other | Source: Ambulatory Visit | Attending: Radiation Oncology | Admitting: Radiation Oncology

## 2011-08-14 ENCOUNTER — Ambulatory Visit
Admission: RE | Admit: 2011-08-14 | Discharge: 2011-08-14 | Disposition: A | Discharge status: Home / Self Care | Payer: Medicare Other | Source: Ambulatory Visit | Attending: Radiation Oncology | Admitting: Radiation Oncology

## 2011-08-15 ENCOUNTER — Encounter: Payer: Self-pay | Admitting: Radiation Oncology

## 2011-08-15 ENCOUNTER — Ambulatory Visit
Admission: RE | Admit: 2011-08-15 | Discharge: 2011-08-15 | Disposition: A | Discharge status: Home / Self Care | Payer: Medicare Other | Source: Ambulatory Visit | Attending: Radiation Oncology | Admitting: Radiation Oncology

## 2011-08-15 VITALS — Wt 145.3 lb

## 2011-08-15 DIAGNOSIS — C773 Secondary and unspecified malignant neoplasm of axilla and upper limb lymph nodes: Secondary | ICD-10-CM

## 2011-08-15 NOTE — Progress Notes (Signed)
[facility] Radiation Oncology Weekly Treatment Note    Name: BERLYN MALINA Date: 08/15/2011 MRN: [id number] DOB: 05-Jan-1921  Status: outpatient    Current dose: 4000  Current fraction:16  Planned dose:4500  Planned fraction:18   MEDICATIONS: Current Outpatient Prescriptions  Medication Sig Dispense Refill  . amLODipine (NORVASC) 5 MG tablet daily.       Marland Kitchen aspirin 81 MG tablet Take 81 mg by mouth daily.        . CRESTOR 20 MG tablet Take 20 mg by mouth daily.       . cyanocobalamin 1000 MCG tablet Take 100 mcg by mouth daily.        . megestrol (MEGACE) 40 MG tablet 80 mg 2 (two) times daily.          ALLERGIES: Review of patient's allergies indicates no known allergies.   LABORATORY DATA:  Lab Results  Component Value Date   WBC 7.6 04/05/2011   HGB 11.5* 04/05/2011   HCT 33.4* 04/05/2011   MCV 94.5 04/05/2011   PLT 211 04/05/2011   Lab Results  Component Value Date   NA 140 02/06/2011   K 4.0 02/06/2011   CL 109 02/06/2011   CO2 21 02/06/2011   Lab Results  Component Value Date   ALT 14 03/02/2010   AST 17 03/02/2010   ALKPHOS 53 03/02/2010   BILITOT 0.4 03/02/2010      NARRATIVE: LOYD MARHEFKA was seen today for weekly treatment management. The chart was checked and port films images were reviewed. Pt doing well - some r shoulder pain.  PHYSICAL EXAMINATION: weight is 145 lb 4.8 oz (65.908 kg).     Skin looks excellent - moderate erythema   ASSESSMENT: Patient tolerating treatments well.    PLAN: Continue treatment as planned. Discussed possible boost - will design on machine an en face electron boost tomorrow. Anticipate an additional 5 treatments.

## 2011-08-15 NOTE — Progress Notes (Signed)
Pt reports slight discomfort in R shoulder. He attributes to arthritis and/or positioning during radiation tx.

## 2011-08-16 ENCOUNTER — Ambulatory Visit
Admission: RE | Admit: 2011-08-16 | Discharge: 2011-08-16 | Disposition: A | Discharge status: Home / Self Care | Payer: Medicare Other | Source: Ambulatory Visit | Attending: Radiation Oncology | Admitting: Radiation Oncology

## 2011-08-16 DIAGNOSIS — C773 Secondary and unspecified malignant neoplasm of axilla and upper limb lymph nodes: Secondary | ICD-10-CM

## 2011-08-19 ENCOUNTER — Ambulatory Visit
Admission: RE | Admit: 2011-08-19 | Discharge: 2011-08-19 | Disposition: A | Discharge status: Home / Self Care | Payer: Medicare Other | Source: Ambulatory Visit | Attending: Radiation Oncology | Admitting: Radiation Oncology

## 2011-08-20 ENCOUNTER — Ambulatory Visit: Payer: Medicare Other

## 2011-08-20 ENCOUNTER — Ambulatory Visit
Admission: RE | Admit: 2011-08-20 | Discharge: 2011-08-20 | Disposition: A | Discharge status: Home / Self Care | Payer: Medicare Other | Source: Ambulatory Visit | Attending: Radiation Oncology | Admitting: Radiation Oncology

## 2011-08-20 DIAGNOSIS — C773 Secondary and unspecified malignant neoplasm of axilla and upper limb lymph nodes: Secondary | ICD-10-CM

## 2011-08-21 ENCOUNTER — Ambulatory Visit
Admission: RE | Admit: 2011-08-21 | Discharge: 2011-08-21 | Disposition: A | Discharge status: Home / Self Care | Payer: Medicare Other | Source: Ambulatory Visit | Attending: Radiation Oncology | Admitting: Radiation Oncology

## 2011-08-21 ENCOUNTER — Ambulatory Visit: Payer: Medicare Other

## 2011-08-22 ENCOUNTER — Encounter: Payer: Self-pay | Admitting: Radiation Oncology

## 2011-08-22 ENCOUNTER — Ambulatory Visit
Admission: RE | Admit: 2011-08-22 | Discharge: 2011-08-22 | Disposition: A | Discharge status: Home / Self Care | Payer: Medicare Other | Source: Ambulatory Visit | Attending: Radiation Oncology | Admitting: Radiation Oncology

## 2011-08-22 VITALS — Wt 143.9 lb

## 2011-08-22 DIAGNOSIS — C773 Secondary and unspecified malignant neoplasm of axilla and upper limb lymph nodes: Secondary | ICD-10-CM

## 2011-08-22 NOTE — Progress Notes (Signed)
Pt reports slight soreness in R axilla, does not require any pain med.

## 2011-08-22 NOTE — Progress Notes (Signed)
[facility] Radiation Oncology Weekly Treatment Note    Name: STEDMAN SUMMERVILLE Date: 08/22/2011 MRN: [id number] DOB: 17-Dec-1920  Status: outpatient    Current dose: 5100  Current fraction:21  Planned dose:5500  Planned fraction:23   MEDICATIONS: Current Outpatient Prescriptions  Medication Sig Dispense Refill  . amLODipine (NORVASC) 5 MG tablet daily.       Marland Kitchen aspirin 81 MG tablet Take 81 mg by mouth daily.        . CRESTOR 20 MG tablet Take 20 mg by mouth daily.       . cyanocobalamin 1000 MCG tablet Take 100 mcg by mouth daily.        . megestrol (MEGACE) 40 MG tablet 80 mg 2 (two) times daily.          ALLERGIES: Review of patient's allergies indicates no known allergies.   LABORATORY DATA:  Lab Results  Component Value Date   WBC 7.6 04/05/2011   HGB 11.5* 04/05/2011   HCT 33.4* 04/05/2011   MCV 94.5 04/05/2011   PLT 211 04/05/2011   Lab Results  Component Value Date   NA 140 02/06/2011   K 4.0 02/06/2011   CL 109 02/06/2011   CO2 21 02/06/2011   Lab Results  Component Value Date   ALT 14 03/02/2010   AST 17 03/02/2010   ALKPHOS 53 03/02/2010   BILITOT 0.4 03/02/2010      NARRATIVE: CARVEL HUSKINS was seen today for weekly treatment management. The chart was checked and pt setup has been reviewed.  PHYSICAL EXAMINATION: weight is 143 lb 14.4 oz (65.273 kg).      Skin looks good - diffuse erythema without desquamation  ASSESSMENT: Patient tolerating treatments well.    PLAN: Continue treatment as planned.

## 2011-08-23 ENCOUNTER — Ambulatory Visit
Admission: RE | Admit: 2011-08-23 | Discharge: 2011-08-23 | Disposition: A | Discharge status: Home / Self Care | Payer: Medicare Other | Source: Ambulatory Visit | Attending: Radiation Oncology | Admitting: Radiation Oncology

## 2011-08-26 ENCOUNTER — Ambulatory Visit
Admission: RE | Admit: 2011-08-26 | Discharge: 2011-08-26 | Disposition: A | Discharge status: Home / Self Care | Payer: Medicare Other | Source: Ambulatory Visit | Attending: Radiation Oncology | Admitting: Radiation Oncology

## 2011-08-26 DIAGNOSIS — C773 Secondary and unspecified malignant neoplasm of axilla and upper limb lymph nodes: Secondary | ICD-10-CM

## 2011-08-31 NOTE — Progress Notes (Signed)
CC:   Ladene Artist, M.D.  DIAGNOSIS:  Metastatic breast cancer.  INDICATION FOR THERAPY:  Palliative.  TREATMENT DATES:  07/25/2011 to 08/26/2011.  SITE/DOSE:  Mr. Tift was treated to the right axilla initially with a photon technique using a left anterior oblique and a right posterior oblique field to a dose of 45 Gy in 18 fractions at 2.5 Gy per fraction. Both 6 and 18 MV photons were used.  The patient then received a boost treatment using an en face electron field with 9 MeV electrons.  This delivered an additional 10 Gy in 5 fractions.  The patient's final total dose was 55 Gy.  NARRATIVE:  Mr. Kimes did quite well during the course of his treatment.  His skin did excellent and this allowed a boost treatment to a higher dose than was initially planned.  FOLLOWUP APPOINTMENT:  One month.    ______________________________ Radene Gunning, M.D., Ph.D. JSM/MEDQ  D:  08/31/2011  T:  08/31/2011  Job:  [id number]

## 2011-08-31 NOTE — Progress Notes (Signed)
DIAGNOSIS:  Breast cancer.  NARRATIVE:  Derek Flores has presented for his 1st fraction of his boost treatment.  The patient will receive an additional 10 Gy using a en face electron field and this requires bolus to ensure that the target is adequately covered with radiation.  Therefore a piece of bolus has been fashioned with a depth of 1 cm.  This represents a simple treatment device that will be used during the remainder of his treatment on a daily basis.    ______________________________ Radene Gunning, M.D., Ph.D. JSM/MEDQ  D:  08/31/2011  T:  08/31/2011  Job:  [id number]

## 2011-08-31 NOTE — Procedures (Signed)
DIAGNOSIS:  History of breast cancer with axillary involvement.  NARRATIVE:  Mr. Dauphinais has undergone simulation for an upcoming boost treatment.  He initially has been planned to receive 45 Gy in a hypo fractionated manner.  He will now receive an additional boost for 10 Gy in 5 fractions at 2 Gy per fraction.  This will consist of a single en face electron field.  Therefore 1 block has been designed for this purpose.  Based on the depth of the boost target 9 MeV electrons will be used.  A special port plan is requested for this final portion of his treatment.  It is anticipated therefore that the patient's final total dose will be 55 Gy.  We have decided to proceed with this boost treatment given how well his skin has done during his initial course of radiation.  It is anticipated that a bolus will also be used to ensure adequate dose to the superficial target.    ______________________________ Radene Gunning, M.D., Ph.D. JSM/MEDQ  D:  08/31/2011  T:  08/31/2011  Job:  [id number]

## 2011-09-17 ENCOUNTER — Other Ambulatory Visit: Payer: Self-pay | Admitting: *Deleted

## 2011-09-17 ENCOUNTER — Ambulatory Visit: Payer: Medicare Other | Admitting: Oncology

## 2011-09-20 ENCOUNTER — Telehealth: Payer: Self-pay | Admitting: Oncology

## 2011-09-20 NOTE — Telephone Encounter (Signed)
called pt and provided appt d/t for april

## 2011-09-24 ENCOUNTER — Encounter: Payer: Self-pay | Admitting: *Deleted

## 2011-09-24 DIAGNOSIS — Z923 Personal history of irradiation: Secondary | ICD-10-CM | POA: Insufficient documentation

## 2011-09-27 ENCOUNTER — Encounter: Payer: Self-pay | Admitting: Radiation Oncology

## 2011-09-27 ENCOUNTER — Ambulatory Visit
Admission: RE | Admit: 2011-09-27 | Discharge: 2011-09-27 | Disposition: A | Discharge status: Home / Self Care | Payer: Medicare Other | Source: Ambulatory Visit | Attending: Radiation Oncology | Admitting: Radiation Oncology

## 2011-09-27 VITALS — BP 147/72 | HR 54 | Temp 97.0°F | Resp 18 | Wt 145.2 lb

## 2011-09-27 DIAGNOSIS — C773 Secondary and unspecified malignant neoplasm of axilla and upper limb lymph nodes: Secondary | ICD-10-CM

## 2011-09-27 NOTE — Progress Notes (Signed)
  Radiation Oncology         [phone] ________________________________  Name: Derek Flores MRN: [id number]  Date: 09/27/2011  DOB: 1920/11/16  Follow-Up Visit Note  CC: Tonita Cong, MD, MD  Ladene Artist, MD  Diagnosis:   Metastatic breast cancer  Interval Since Last Radiation:  Approximately one month  Narrative:  The patient returns today for routine follow-up.  He indicates that he is doing well overall. He states that his skin peeled and the right axilla in the site of treatment but this has healed up well since then. He notes no pain in this area. He does still feel a "knot" but he feels that this is smaller and this is not bothering him.                              ALLERGIES:   has no known allergies.  Meds: Current Outpatient Prescriptions  Medication Sig Dispense Refill  . amLODipine (NORVASC) 5 MG tablet daily.       Marland Kitchen aspirin 81 MG tablet Take 81 mg by mouth daily.        . CRESTOR 20 MG tablet Take 20 mg by mouth daily.       . cyanocobalamin 1000 MCG tablet Take 100 mcg by mouth daily.        . megestrol (MEGACE) 40 MG tablet 80 mg 2 (two) times daily.         Physical Findings: The patient is in no acute distress. Patient is alert and oriented.  weight is 145 lb 3.2 oz (65.862 kg). His oral temperature is 97 F (36.1 C). His blood pressure is 147/72 and his pulse is 54. His respiration is 18.  The patient exhibits some hyperpigmentation in the treatment area. No ongoing significant desquamation. A palpable lymph node is still present but this appears somewhat smaller. Along the skin there is also an area of fibrosis but this has flattened out and is much smaller and there is no skin ulceration associated with this. This appears like a residual fibrotic area.  Lab Findings: Lab Results  Component Value Date   WBC 7.6 04/05/2011   HGB 11.5* 04/05/2011   HCT 33.4* 04/05/2011   MCV 94.5 04/05/2011   PLT 211 04/05/2011     Radiographic Findings: No  results found.  Impression:  The patient is recovering from the effects of radiation.  He is doing well in regards to this without any ongoing issues of skin irritation. The patient appears to have had a response although there still are some palpable findings in the right axilla. All of this has improved. As noted above the area on the skin looks like a smaller area of fibrosis. I discussed with the patient that this may or may not be active but I am overall pleased with his response. It is certainly possible however I given his findings to have some regrowth in this area. His symptoms certainly have improved.  Plan:  The patient will return to our clinic on a when necessary basis. He is continuing with close followup with Dr. Truett Perna and is scheduled to see him in 1 month. The patient knows be happy to see him at any point if I can be of further assistance.   Radene Gunning, M.D., Ph.D.

## 2011-09-27 NOTE — Progress Notes (Signed)
Patient presents to the clinic today unaccompanied for a follow up appointment with Dr. Mitzi Hansen. Patient is alert and oriented to person, place, and time. No distress noted. Steady gait noted. Pleasant affect noted. Patient denies pain at this time. Patient reports he ran out of megace approximately one month ago. Weight has remained stable at 145. Patient reports that he is scheduled to see Dr. Truett Perna 10/25/2011. Patient denies nausea, vomiting, headache, dizziness, or diarrhea. Reported all findings to Dr. Mitzi Hansen.

## 2011-10-25 ENCOUNTER — Telehealth: Payer: Self-pay | Admitting: Oncology

## 2011-10-25 ENCOUNTER — Ambulatory Visit (HOSPITAL_BASED_OUTPATIENT_CLINIC_OR_DEPARTMENT_OTHER): Payer: Medicare Other | Admitting: Oncology

## 2011-10-25 VITALS — BP 152/60 | HR 47 | Temp 96.7°F | Ht 70.5 in | Wt 140.1 lb

## 2011-10-25 DIAGNOSIS — C773 Secondary and unspecified malignant neoplasm of axilla and upper limb lymph nodes: Secondary | ICD-10-CM

## 2011-10-25 DIAGNOSIS — C50919 Malignant neoplasm of unspecified site of unspecified female breast: Secondary | ICD-10-CM

## 2011-10-25 DIAGNOSIS — Z170 Estrogen receptor positive status [ER+]: Secondary | ICD-10-CM

## 2011-10-25 DIAGNOSIS — C50929 Malignant neoplasm of unspecified site of unspecified male breast: Secondary | ICD-10-CM

## 2011-10-25 DIAGNOSIS — N289 Disorder of kidney and ureter, unspecified: Secondary | ICD-10-CM

## 2011-10-25 NOTE — Telephone Encounter (Signed)
appts made and printed for pt aom °

## 2011-10-25 NOTE — Progress Notes (Signed)
OFFICE PROGRESS NOTE   INTERVAL HISTORY:   He returns as scheduled. No new complaint. Stable exertional dyspnea. The right axilla mass has decreased in size since completing radiation 08/26/2011.  Objective:  Vital signs in last 24 hours:  Blood pressure 152/60, pulse 47, temperature 96.7 F (35.9 C), temperature source Oral, height 5' 10.5" (1.791 m), weight 140 lb 1.6 oz (63.549 kg).    HEENT: Neck without mass Lymphatics: No cervical, supraclavicular, or axillary nodes Resp: Inspiratory rhonchi at the lower posterior chest bilaterally, no respiratory distress Cardio: Regular rate and rhythm GI: No hepatomegaly Vascular: Trace pitting edema at the low leg bilaterally  Skin: There is a firm 1-2 cm cutaneous mass at the medial right axilla overlying the pectoralis musculature. The mass appears smaller.   Medications: I have reviewed the patient's current medications.  Assessment/Plan: 1.Metastatic breast cancer involving a right axillary subcutaneous mass and mediastinal lymphadenopathy. A fine-needle aspiration of the mass was consistent with metastatic breast cancer. The tumor was ER/PR positive. A staging PET scan revealed hypermetabolic lymph nodes in the right axilla and mediastinum.  -Arimidex was initiated after an office visit 12/11/2008.  -A. restaging CT on 06/19/2010 revealed a slight decrease the right axillary lymphadenopathy compared to the PET scan from June of 2000 and and no evidence for progressive metastatic disease.  -The right-sided mass appeared larger with satellite lesions on exam 10/05/2010. Megace was initiated on 10/05/2010.  -A. restaging CT 01/29/2011 showed stable right axillary lymphadenopathy -he began palliative radiation to the right axillary line mass on 07/25/2011, completed 08/26/2011. 2. Remote history of bilateral breast cancer, diagnosis of multinode positive right-sided breast cancer in 1996, status with adjuvant CMF chemotherapy and tamoxifen.  Muscles completed in 2001. Status post bilateral mastectomies.  3. History of coronary artery  4. Hypertension  5. Hyperlipidemia  6. Mild anemia a CBC 12/21/2008. The hemoglobin has remained stable  7. Chronic renal insufficiency  8. Lipoma of the left arm  9. History of arthralgias secondary to degenerative arthritis  10. Chronic bradycardia  11. Left leg pain. A plain x-ray evaluation of the lumbosacral spine left leg was negative for evidence of metastatic disease in March of 2012. A bone scan was also negative. The pain resolved.   Disposition:  He appears stable. The right axillary mass is smaller after completing palliative radiation. We decided to follow him with an observation approach. He will return for an office visit in 6 weeks.   [name], MD  10/25/2011  5:41 PM

## 2011-12-05 ENCOUNTER — Ambulatory Visit (HOSPITAL_BASED_OUTPATIENT_CLINIC_OR_DEPARTMENT_OTHER): Payer: Medicare Other | Admitting: Oncology

## 2011-12-05 VITALS — BP 126/58 | HR 42 | Temp 96.7°F | Ht 70.5 in | Wt 136.7 lb

## 2011-12-05 DIAGNOSIS — Z9010 Acquired absence of unspecified breast and nipple: Secondary | ICD-10-CM

## 2011-12-05 DIAGNOSIS — Z853 Personal history of malignant neoplasm of breast: Secondary | ICD-10-CM

## 2011-12-05 DIAGNOSIS — R001 Bradycardia, unspecified: Secondary | ICD-10-CM

## 2011-12-05 DIAGNOSIS — I498 Other specified cardiac arrhythmias: Secondary | ICD-10-CM

## 2011-12-05 DIAGNOSIS — C779 Secondary and unspecified malignant neoplasm of lymph node, unspecified: Secondary | ICD-10-CM

## 2011-12-05 NOTE — Progress Notes (Signed)
   [facility]    OFFICE PROGRESS NOTE   INTERVAL HISTORY:   He returns as scheduled. No new complaint. Stable exertional dyspnea. The right axillary mass has decreased in size.  Objective:  Vital signs in last 24 hours:  Blood pressure 126/58, pulse 42, temperature 96.7 F (35.9 C), temperature source Oral, height 5' 10.5" (1.791 m), weight 136 lb 11.2 oz (62.007 kg).    HEENT: Neck without mass Lymphatics: No cervical, supraclavicular, or right axillary nodes. There is an approximate 1.5 cm firm mass in the cutaneous tissue at the right axillary line overlying the pectoralis muscle. No satellite nodules. Resp: Inspiratory rhonchi at the low posterior chest bilaterally. Cardio: Regular rhythm, bradycardia GI: No hepatomegaly Vascular: No leg edema Neuro: Alert and oriented      Medications: I have reviewed the patient's current medications.  Assessment/Plan: 1.Metastatic breast cancer involving a right axillary subcutaneous mass and mediastinal lymphadenopathy. A fine-needle aspiration of the mass was consistent with metastatic breast cancer. The tumor was ER/PR positive. A staging PET scan revealed hypermetabolic lymph nodes in the right axilla and mediastinum.  -Arimidex was initiated after an office visit 12/11/2008.  -A. restaging CT on 06/19/2010 revealed a slight decrease the right axillary lymphadenopathy compared to the PET scan from June of 2000 and and no evidence for progressive metastatic disease.  -The right-sided mass appeared larger with satellite lesions on exam 10/05/2010. Megace was initiated on 10/05/2010.  -A. restaging CT 01/29/2011 showed stable right axillary lymphadenopathy -he began palliative radiation to the right axillary line mass on 07/25/2011, completed 08/26/2011. The right axillary mass has decreased in size. 2. Remote history of bilateral breast cancer, diagnosis of multinode positive right-sided breast cancer in 1996, status with  adjuvant CMF chemotherapy and tamoxifen. Muscles completed in 2001. Status post bilateral mastectomies.  3. History of coronary artery  4. Hypertension  5. Hyperlipidemia  6. Mild anemia a CBC 12/21/2008. The hemoglobin has remained stable  7. Chronic renal insufficiency  8. Lipoma of the left arm  9. History of arthralgias secondary to degenerative arthritis  10. Chronic bradycardia  11. Left leg pain. A plain x-ray evaluation of the lumbosacral spine left leg was negative for evidence of metastatic disease in March of 2012. A bone scan was also negative. The pain resolved.    Disposition:  The right axillary mass has decreased in size. No evidence for progression of the metastatic breast cancer. We will continue following him with an observation approach.  Derek Flores has chronic bradycardia. During the physical exam today I palpated the carotid pulse and he had a pre-syncope event. His mental status returned to baseline within a few seconds. No recurrent presyncope while in the office today. His blood pressure remained stable and repeat pulse checks confirmed a manual pulse of 44. He was able and leg in the hall without difficulty.  Derek Flores has significant exertional dyspnea. I wonder whether this in part to be related to the bradycardia. I discussed the case with Dr. Ladona Ridgel and he has agreed to see Derek Flores in consultation.  He will return for an office visit at the cancer Center in 6 weeks.   Derek Papas, MD  12/05/2011  1:26 PM

## 2011-12-06 ENCOUNTER — Telehealth: Payer: Self-pay | Admitting: *Deleted

## 2011-12-06 NOTE — Telephone Encounter (Signed)
Called patient to alert him Dr. Truett Perna spoke with cardiologist, Dr. Ladona Ridgel yesterday and his office will be calling him for appointment to follow up on his bradycardia. Patient informed RN he had forgotten he already had cardiologist in [facility], Dr. Juliane Lack and has made appointment for 01/23/12. Suggested he be seen earlier. Called cardiology office [phone] and made nurse aware of reason for return visit and need to be seen before July. Faxed office note to [phone]

## 2012-01-15 ENCOUNTER — Telehealth: Payer: Self-pay | Admitting: Oncology

## 2012-01-15 NOTE — Telephone Encounter (Signed)
pt called and l/m to cx 7/11 gbs appt,called pt back and l/m that appt cx and for him to c.b to r.s    aom

## 2012-01-16 ENCOUNTER — Ambulatory Visit: Payer: Medicare Other | Admitting: Oncology

## 2012-07-14 ENCOUNTER — Telehealth: Payer: Self-pay | Admitting: Oncology

## 2012-07-14 NOTE — Telephone Encounter (Signed)
Talked to patient r/s appt  he missed last July 2013 with Md

## 2012-07-23 ENCOUNTER — Ambulatory Visit (HOSPITAL_BASED_OUTPATIENT_CLINIC_OR_DEPARTMENT_OTHER): Payer: Medicare Other | Admitting: Oncology

## 2012-07-23 ENCOUNTER — Telehealth: Payer: Self-pay | Admitting: Oncology

## 2012-07-23 VITALS — BP 146/64 | HR 49 | Temp 98.4°F | Resp 20 | Ht 70.5 in | Wt 138.0 lb

## 2012-07-23 DIAGNOSIS — N189 Chronic kidney disease, unspecified: Secondary | ICD-10-CM

## 2012-07-23 DIAGNOSIS — C50929 Malignant neoplasm of unspecified site of unspecified male breast: Secondary | ICD-10-CM

## 2012-07-23 DIAGNOSIS — Z170 Estrogen receptor positive status [ER+]: Secondary | ICD-10-CM

## 2012-07-23 DIAGNOSIS — C50919 Malignant neoplasm of unspecified site of unspecified female breast: Secondary | ICD-10-CM

## 2012-07-23 DIAGNOSIS — C773 Secondary and unspecified malignant neoplasm of axilla and upper limb lymph nodes: Secondary | ICD-10-CM

## 2012-07-23 NOTE — Progress Notes (Signed)
   [facility]    OFFICE PROGRESS NOTE   INTERVAL HISTORY:   He was last seen at the cancer Center in May of 2013. He missed a scheduled follow up appointment. Mr. Duris underwent a right knee replacement in September of 2013. This was followed by a prolonged rehabilitation stay. He reports significant improvement in right knee pain following surgery. He continues to have pain in the left knee. Stable exertional dyspnea. No new complaint. There is no palpable mass at the right axilla.  Objective:  Vital signs in last 24 hours:  Blood pressure 146/64, pulse 49, temperature 98.4 F (36.9 C), temperature source Oral, resp. rate 20, height 5' 10.5" (1.791 m), weight 138 lb (62.596 kg).    HEENT: Neck without mass Lymphatics: No cervical, supraclavicular, or axillary nodes Resp: Inspiratory rhonchi at the low posterior chest bilaterally, no respiratory distress Cardio: Regular rate and rhythm, bradycardia GI: No hepatomegaly Vascular: Trace pretibial edema bilaterally  Skin: Status post bilateral mastectomy. No evidence for chest wall tumor recurrence. There is slight nodularity at the site of a scar at the right axillary line. No palpable mass. No satellite nodules. Apparent lipoma near the right shoulder joint  Portacath/PICC-without erythema   Medications: I have reviewed the patient's current medications.  Assessment/Plan: 1.Metastatic breast cancer involving a right axillary subcutaneous mass and mediastinal lymphadenopathy. A fine-needle aspiration of the mass was consistent with metastatic breast cancer. The tumor was ER/PR positive. A staging PET scan revealed hypermetabolic lymph nodes in the right axilla and mediastinum.  -Arimidex was initiated after an office visit 12/11/2008.  -A. restaging CT on 06/19/2010 revealed a slight decrease the right axillary lymphadenopathy compared to the PET scan from June of 2000 and and no evidence for progressive metastatic  disease.  -The right-sided mass appeared larger with satellite lesions on exam 10/05/2010. Megace was initiated on 10/05/2010.  -A. restaging CT 01/29/2011 showed stable right axillary lymphadenopathy -he began palliative radiation to the right axillary line mass on 07/25/2011, completed 08/26/2011. The right axillary mass has decreased in size.  2. Remote history of bilateral breast cancer, diagnosis of multinode positive right-sided breast cancer in 1996, status with adjuvant CMF chemotherapy and tamoxifen. Muscles completed in 2001. Status post bilateral mastectomies.  3. History of coronary artery  4. Hypertension  5. Hyperlipidemia  6. history of mild anemia 7. Chronic renal insufficiency  8. Lipoma of the left arm  9. History of arthralgias secondary to degenerative arthritis  10. Chronic bradycardia  11. Left leg pain. A plain x-ray evaluation of the lumbosacral spine left leg was negative for evidence of metastatic disease in March of 2012. A bone scan was also negative. The pain resolved.  12. Status post right knee replacement 2013   Disposition:  Derek Flores reports being evaluated by cardiology for the bradycardia last year. There is no evidence of progressive breast cancer. The right axillary mass is no longer palpable with only slight residual nodularity at the previous biopsy site. He will remain off of specific therapy for breast cancer. Derek Flores will return for an office visit in 4 months.  [name], MD  07/23/2012  2:15 PM

## 2012-07-23 NOTE — Telephone Encounter (Signed)
gv and printed appt schedule for pt for May...the patient aware

## 2012-11-20 ENCOUNTER — Telehealth: Payer: Self-pay | Admitting: Oncology

## 2012-11-20 ENCOUNTER — Ambulatory Visit (HOSPITAL_BASED_OUTPATIENT_CLINIC_OR_DEPARTMENT_OTHER): Payer: Medicare Other | Admitting: Oncology

## 2012-11-20 ENCOUNTER — Encounter: Payer: Self-pay | Admitting: Oncology

## 2012-11-20 VITALS — BP 142/57 | HR 50 | Temp 96.8°F | Resp 19 | Ht 70.0 in | Wt 131.2 lb

## 2012-11-20 DIAGNOSIS — C50919 Malignant neoplasm of unspecified site of unspecified female breast: Secondary | ICD-10-CM

## 2012-11-20 DIAGNOSIS — Z853 Personal history of malignant neoplasm of breast: Secondary | ICD-10-CM

## 2012-11-20 NOTE — Progress Notes (Signed)
   [facility]    OFFICE PROGRESS NOTE   INTERVAL HISTORY:   Mr. Rhinesmith returns as scheduled. He reports undergoing left knee replacement surgery approximately 1 month ago. This was followed by a rehabilitation stay. He lost weight while in the nursing facility. He is now at home and has returned to his usual activities. No new complaint. Stable exertional dyspnea. Good appetite.  Objective:  Vital signs in last 24 hours:  Blood pressure 142/57, pulse 50, temperature 96.8 F (36 C), temperature source Oral, resp. rate 19, height 5\' 10"  (1.778 m), weight 131 lb 3.2 oz (59.512 kg).    HEENT: Neck without mass Lymphatics: No cervical, supraclavicular, or axillary nodes Resp: Inspiratory rhonchi at the left greater than right lower chest, no respiratory distress Cardio: Regular rate and rhythm GI: No hepatomegaly Vascular: No leg edema  Breasts: Status post bilateral mastectomy. No chest wall nodules. Radiation changes at the right lateral chest. No mass or nodule at the right pectoral/axillary line.  Medications: I have reviewed the patient's current medications.  Assessment/Plan: 1.Metastatic breast cancer involving a right axillary subcutaneous mass and mediastinal lymphadenopathy. A fine-needle aspiration of the mass was consistent with metastatic breast cancer. The tumor was ER/PR positive. A staging PET scan revealed hypermetabolic lymph nodes in the right axilla and mediastinum.  -Arimidex was initiated after an office visit 12/11/2008.  -A. restaging CT on 06/19/2010 revealed a slight decrease the right axillary lymphadenopathy compared to the PET scan from June of 2000 and and no evidence for progressive metastatic disease.  -The right-sided mass appeared larger with satellite lesions on exam 10/05/2010. Megace was initiated on 10/05/2010.  -A. restaging CT 01/29/2011 showed stable right axillary lymphadenopathy -he began palliative radiation to the right  axillary line mass on 07/25/2011, completed 08/26/2011. The right axillary mass has resolved.  2. Remote history of bilateral breast cancer, diagnosis of multinode positive right-sided breast cancer in 1996, status with adjuvant CMF chemotherapy and tamoxifen. Status post bilateral mastectomies.  3. History of coronary artery  4. Hypertension  5. Hyperlipidemia  6. history of mild anemia  7. Chronic renal insufficiency  8. Lipoma of the left arm  9. History of arthralgias secondary to degenerative arthritis  10. Chronic bradycardia  11. Left leg pain. A plain x-ray evaluation of the lumbosacral spine left leg was negative for evidence of metastatic disease in March of 2012. A bone scan was also negative. The pain resolved.  12. Status post right knee replacement 2013 , left knee replacement 2014  Disposition:  There is no clinical evidence for progression of the metastatic breast cancer. He remains off of specific therapy for breast cancer. He will return for an office visit in 4 months.   [name], MD  11/20/2012  3:54 PM

## 2012-11-20 NOTE — Telephone Encounter (Signed)
Gave pt appt for September 2014 MD only

## 2013-03-23 ENCOUNTER — Telehealth: Payer: Self-pay | Admitting: Oncology

## 2013-03-23 ENCOUNTER — Ambulatory Visit (HOSPITAL_BASED_OUTPATIENT_CLINIC_OR_DEPARTMENT_OTHER): Payer: Medicare Other | Admitting: Oncology

## 2013-03-23 VITALS — BP 140/58 | HR 58 | Temp 96.7°F | Resp 20 | Ht 70.0 in | Wt 140.9 lb

## 2013-03-23 DIAGNOSIS — C50929 Malignant neoplasm of unspecified site of unspecified male breast: Secondary | ICD-10-CM

## 2013-03-23 DIAGNOSIS — C50919 Malignant neoplasm of unspecified site of unspecified female breast: Secondary | ICD-10-CM

## 2013-03-23 NOTE — Telephone Encounter (Signed)
, °

## 2013-03-23 NOTE — Progress Notes (Signed)
    [facility]    OFFICE PROGRESS NOTE   INTERVAL HISTORY:   Derek Flores returns for scheduled followup of breast cancer. He reports no change over the chest wall. He can no longer palpate a mass at the right lateral chest wall. Good appetite and energy level. Stable exertional dyspnea.  Objective:  Vital signs in last 24 hours:  Blood pressure 140/58, pulse 58, temperature 96.7 F (35.9 C), temperature source Oral, resp. rate 20, height 5\' 10"  (1.778 m), weight 140 lb 14.4 oz (63.912 kg).    HEENT: Neck without mass Lymphatics: No cervical, supraclavicular, or axillary nodes Resp: Inspiratory rhonchi at the lower posterior chest bilaterally, no respiratory distress Cardio: Regular rate and rhythm, bradycardia GI: No hepatomegaly, nontender Vascular: Trace low pretibial/ankle edema Breasts: Status post bilateral mastectomy. No chest wall nodules. Radiation changes at the right lateral chest without a palpable mass     Medications: I have reviewed the patient's current medications.  Assessment/Plan: 1.Metastatic breast cancer involving a right axillary subcutaneous mass and mediastinal lymphadenopathy. A fine-needle aspiration of the mass was consistent with metastatic breast cancer. The tumor was ER/PR positive. A staging PET scan revealed hypermetabolic lymph nodes in the right axilla and mediastinum.  -Arimidex was initiated after an office visit 12/11/2008.  -A. restaging CT on 06/19/2010 revealed a slight decrease the right axillary lymphadenopathy compared to the PET scan from June of 2000 and and no evidence for progressive metastatic disease.  -The right-sided mass appeared larger with satellite lesions on exam 10/05/2010. Megace was initiated on 10/05/2010.  -A. restaging CT 01/29/2011 showed stable right axillary lymphadenopathy -he began palliative radiation to the right axillary line mass on 07/25/2011, completed 08/26/2011. The right axillary mass has  resolved.  2. Remote history of bilateral breast cancer, diagnosis of multinode positive right-sided breast cancer in 1996, status with adjuvant CMF chemotherapy and tamoxifen. Status post bilateral mastectomies.  3. History of coronary artery  4. Hypertension  5. Hyperlipidemia  6. history of mild anemia  7. Chronic renal insufficiency  8. Lipoma of the left arm  9. History of arthralgias secondary to degenerative arthritis  10. Chronic bradycardia  11. Left leg pain. A plain x-ray evaluation of the lumbosacral spine left leg was negative for evidence of metastatic disease in March of 2012. A bone scan was also negative. The pain resolved.  12. Status post right knee replacement 2013 , left knee replacement 2014    Disposition:  Derek Flores appears stable. There is no clinical evidence for progression of metastatic breast cancer. He remains off of specific therapy for breast cancer. Derek Flores will return for an office visit in 4 months.   Derek Papas, MD  03/23/2013  12:50 PM

## 2013-08-02 ENCOUNTER — Ambulatory Visit (HOSPITAL_BASED_OUTPATIENT_CLINIC_OR_DEPARTMENT_OTHER): Payer: Medicare Other | Admitting: Oncology

## 2013-08-02 ENCOUNTER — Encounter (INDEPENDENT_AMBULATORY_CARE_PROVIDER_SITE_OTHER): Payer: Self-pay

## 2013-08-02 VITALS — BP 129/52 | HR 45 | Temp 98.1°F | Resp 18 | Ht 70.0 in | Wt 147.6 lb

## 2013-08-02 DIAGNOSIS — C50919 Malignant neoplasm of unspecified site of unspecified female breast: Secondary | ICD-10-CM

## 2013-08-02 DIAGNOSIS — C50929 Malignant neoplasm of unspecified site of unspecified male breast: Secondary | ICD-10-CM

## 2013-08-02 NOTE — Progress Notes (Signed)
   [place]    OFFICE PROGRESS NOTE   INTERVAL HISTORY:   Derek Flores returns as scheduled. No palpable mass over the chest wall. No pain. Good appetite. He complains of exertional dyspnea. This has been a chronic symptom. He is scheduled appointment with his cardiologist. He reports a negative chest x-ray several months ago. No cough.  Objective:  Vital signs in last 24 hours:  Blood pressure 129/52, pulse 45, temperature 98.1 F (36.7 C), temperature source Oral, resp. rate 18, height 5\' 10"  (1.778 m), weight 147 lb 9.6 oz (66.951 kg).    HEENT: Neck without mass Lymphatics: No cervical, supraclavicular, or axillary nodes Resp: End inspiratory rhonchi at the left greater than right base, no respiratory distress at rest Cardio: Regular rate and rhythm, bradycardia GI: No hepatomegaly Vascular: Trace low pretibial/ankle edema bilaterally Skin: Radiation changes at the right lateral chest wall/axilla. No cutaneous nodule. Status post bilateral mastectomy.     Medications: I have reviewed the patient's current medications.  Assessment/Plan: 1.Metastatic breast cancer involving a right axillary subcutaneous mass and mediastinal lymphadenopathy. A fine-needle aspiration of the mass was consistent with metastatic breast cancer. The tumor was ER/PR positive. A staging PET scan revealed hypermetabolic lymph nodes in the right axilla and mediastinum.  -Arimidex was initiated after an office visit 12/11/2008.  -A. restaging CT on 06/19/2010 revealed a slight decrease the right axillary lymphadenopathy compared to the PET scan from June of 2000 and and no evidence for progressive metastatic disease.  -The right-sided mass appeared larger with satellite lesions on exam 10/05/2010. Megace was initiated on 10/05/2010.  -A. restaging CT 01/29/2011 showed stable right axillary lymphadenopathy -he began palliative radiation to the right axillary line mass on 07/25/2011, completed  08/26/2011. The right axillary mass has resolved.  2. Remote history of bilateral breast cancer, diagnosis of multinode positive right-sided breast cancer in 1996, status with adjuvant CMF chemotherapy and tamoxifen. Status post bilateral mastectomies.  3. History of coronary artery  4. Hypertension  5. Hyperlipidemia  6. history of mild anemia  7. Chronic renal insufficiency  8. Lipoma of the left arm  9. History of arthralgias secondary to degenerative arthritis  10. Chronic bradycardia  11. Left leg pain. A plain x-ray evaluation of the lumbosacral spine left leg was negative for evidence of metastatic disease in March of 2012. A bone scan was also negative. The pain resolved.  12. Status post right knee replacement 2013 , left knee replacement 2014   Disposition:  He remains off of specific therapy for breast cancer. No clinical evidence of disease progression. I encouraged him to see his cardiologist for evaluation of the dyspnea. Mr. Eggenberger will return for an office visit in 4 months.   Betsy Coder, MD  08/02/2013  11:08 AM

## 2013-12-07 ENCOUNTER — Telehealth: Payer: Self-pay | Admitting: Oncology

## 2013-12-07 ENCOUNTER — Ambulatory Visit (HOSPITAL_BASED_OUTPATIENT_CLINIC_OR_DEPARTMENT_OTHER): Payer: Medicare Other | Admitting: Oncology

## 2013-12-07 ENCOUNTER — Ambulatory Visit (HOSPITAL_COMMUNITY)
Admission: RE | Admit: 2013-12-07 | Discharge: 2013-12-07 | Disposition: A | Discharge status: Home / Self Care | Payer: Medicare Other | Source: Ambulatory Visit | Attending: Oncology | Admitting: Oncology

## 2013-12-07 VITALS — BP 139/57 | HR 57 | Temp 97.0°F | Resp 18 | Ht 70.0 in | Wt 148.9 lb

## 2013-12-07 DIAGNOSIS — Z9010 Acquired absence of unspecified breast and nipple: Secondary | ICD-10-CM | POA: Insufficient documentation

## 2013-12-07 DIAGNOSIS — C50919 Malignant neoplasm of unspecified site of unspecified female breast: Secondary | ICD-10-CM

## 2013-12-07 DIAGNOSIS — C50929 Malignant neoplasm of unspecified site of unspecified male breast: Secondary | ICD-10-CM

## 2013-12-07 DIAGNOSIS — Z853 Personal history of malignant neoplasm of breast: Secondary | ICD-10-CM | POA: Insufficient documentation

## 2013-12-07 DIAGNOSIS — Z170 Estrogen receptor positive status [ER+]: Secondary | ICD-10-CM

## 2013-12-07 NOTE — Telephone Encounter (Signed)
pt given schedule for sept and sent to [facility]

## 2013-12-07 NOTE — Progress Notes (Signed)
  Derek Flores   Diagnosis: Breast cancer  INTERVAL HISTORY:   Derek Flores returns as scheduled. No change over the chest wall. He has developed an erythematous rash over the trunk since starting Lasix. Stable exertional dyspnea. He continues to work on his farm.  Objective:  Vital signs in last 24 hours:  Blood pressure 139/57, pulse 57, temperature 97 F (36.1 C), temperature source Oral, resp. rate 18, height 5\' 10"  (1.778 m), weight 148 lb 14.4 oz (67.541 kg), SpO2 96.00%.    HEENT: Neck without mass Lymphatics: Less than 1 cm mobile left axillary node, no cervical, supraclavicular, or right axillary node Resp: Lungs with rhonchi at the lower posterior chest bilaterally, no respiratory Cardio: Regular rate and rhythm with premature beats GI: No hepatomegaly, nontender Vascular: 1+ pitting edema at the left greater than right leg below the knee  Skin: Erythematous rash over the trunk without vesicles or nodularity    Imaging: Chest x-ray pending today  Medications: I have reviewed the patient's current medications.  Assessment/Plan: 1.Metastatic breast cancer involving a right axillary subcutaneous mass and mediastinal lymphadenopathy. A fine-needle aspiration of the mass was consistent with metastatic breast cancer. The tumor was ER/PR positive. A staging PET scan revealed hypermetabolic lymph nodes in the right axilla and mediastinum.  -Arimidex was initiated after an office visit 12/11/2008.  -A. restaging CT on 06/19/2010 revealed a slight decrease the right axillary lymphadenopathy compared to the PET scan from June of 2000 and and no evidence for progressive metastatic disease.  -The right-sided mass appeared larger with satellite lesions on exam 10/05/2010. Megace was initiated on 10/05/2010.  -A. restaging CT 01/29/2011 showed stable right axillary lymphadenopathy -he began palliative radiation to the right axillary line mass on  07/25/2011, completed 08/26/2011. The right axillary mass has resolved.  2. Remote history of bilateral breast cancer, diagnosis of multinode positive right-sided breast cancer in 1996, status with adjuvant CMF chemotherapy and tamoxifen. Status post bilateral mastectomies.  3. History of coronary artery  4. Hypertension  5. Hyperlipidemia  6. history of mild anemia  7. Chronic renal insufficiency  8. Lipoma of the left arm  9. History of arthralgias secondary to degenerative arthritis  10. Chronic bradycardia  11. Left leg pain. A plain x-ray evaluation of the lumbosacral spine left leg was negative for evidence of metastatic disease in March of 2012. A bone scan was also negative. The pain resolved.  12. Status post right knee replacement 2013 , left knee replacement 2014  13. Rash-this has the appearance of a drug rash, I recommended he contact his cardiologist to evaluate the rash and consider changing diuretics   Disposition:  Derek Flores remains off of specific therapy for breast cancer. No clinical evidence of disease progression. We obtained a chest x-ray to look for evidence of metastatic disease. I recommended he followup with Dr. Claiborne Billings or his cardiologist to evaluate the skin rash. This has the appearance of a drug rash. He recently started Lasix.  Derek Flores will return for an office visit in 4 months.  Ladell Pier, MD  12/07/2013  4:39 PM

## 2013-12-08 ENCOUNTER — Telehealth: Payer: Self-pay | Admitting: *Deleted

## 2013-12-08 NOTE — Telephone Encounter (Signed)
Message copied by Brien Few on Wed Dec 08, 2013  3:36 PM ------      Message from: Derek Flores      Created: Tue Dec 07, 2013  5:57 PM       Please call patient, cxr if negative for cancer or heart failure ------

## 2013-12-08 NOTE — Telephone Encounter (Signed)
Left message requesting pt to call office for CXRay results.

## 2013-12-29 ENCOUNTER — Telehealth: Payer: Self-pay | Admitting: *Deleted

## 2013-12-29 NOTE — Telephone Encounter (Signed)
Message copied by Domenic Schwab on Wed Dec 29, 2013  2:33 PM ------      Message from: Ladell Pier      Created: Tue Dec 07, 2013  5:57 PM       Please call patient, cxr if negative for cancer or heart failure ------

## 2013-12-29 NOTE — Telephone Encounter (Signed)
Per Dr. Benay Spice; notified pt chest xray is negative for cancer or heart failure.  Pt verbalized understanding of information.

## 2014-04-04 ENCOUNTER — Telehealth: Payer: Self-pay | Admitting: Oncology

## 2014-04-04 ENCOUNTER — Encounter: Payer: Self-pay | Admitting: Oncology

## 2014-04-04 ENCOUNTER — Ambulatory Visit (HOSPITAL_BASED_OUTPATIENT_CLINIC_OR_DEPARTMENT_OTHER): Payer: Medicare Other | Admitting: Oncology

## 2014-04-04 VITALS — BP 144/68 | HR 58 | Temp 97.7°F | Resp 18 | Wt 146.6 lb

## 2014-04-04 DIAGNOSIS — N189 Chronic kidney disease, unspecified: Secondary | ICD-10-CM

## 2014-04-04 DIAGNOSIS — C50929 Malignant neoplasm of unspecified site of unspecified male breast: Secondary | ICD-10-CM

## 2014-04-04 DIAGNOSIS — Z23 Encounter for immunization: Secondary | ICD-10-CM

## 2014-04-04 DIAGNOSIS — Z170 Estrogen receptor positive status [ER+]: Secondary | ICD-10-CM

## 2014-04-04 MED ORDER — INFLUENZA VAC SPLIT QUAD 0.5 ML IM SUSY
0.5000 mL | PREFILLED_SYRINGE | Freq: Once | INTRAMUSCULAR | Status: AC
  Administered 2014-04-04: 0.5 mL via INTRAMUSCULAR
  Filled 2014-04-04: qty 0.5

## 2014-04-04 NOTE — Telephone Encounter (Signed)
gv pt appt schedule for jan 2016 °

## 2014-04-04 NOTE — Progress Notes (Signed)
  [place] OFFICE PROGRESS NOTE   Diagnosis: Breast cancer  INTERVAL HISTORY:   Mr. Derek Flores returns as scheduled. No new complaint. No change over the chest wall. Stable exertional dyspnea. He continues to have left greater than right lower leg and ankle edema.  Objective:  Vital signs in last 24 hours:  Blood pressure 144/68, pulse 58, temperature 97.7 F (36.5 C), temperature source Oral, resp. rate 18, weight 146 lb 9.6 oz (66.497 kg).    HEENT: Neck without mass Lymphatics: No cervical, supra-clavicular, axillary, or inguinal nodes Resp: Inspiratory rhonchi at the lower posterior chest bilaterally, no respiratory distress Cardio: Regular rate and rhythm GI: No hepatomegaly Vascular: Trace pitting edema at the ankles bilaterally on the left greater than right Breasts: Status post bilateral mastectomy. No chest wall nodules. Radiation telangiectasias at the right lateral chest wall/axilla    Medications: I have reviewed the patient's current medications.  Assessment/Plan: 1.Metastatic breast cancer involving a right axillary subcutaneous mass and mediastinal lymphadenopathy. A fine-needle aspiration of the mass was consistent with metastatic breast cancer. The tumor was ER/PR positive. A staging PET scan revealed hypermetabolic lymph nodes in the right axilla and mediastinum.  -Arimidex was initiated after an office visit 12/11/2008.  -A. restaging CT on 06/19/2010 revealed a slight decrease the right axillary lymphadenopathy compared to the PET scan from June of 2000 and and no evidence for progressive metastatic disease.  -The right-sided mass appeared larger with satellite lesions on exam 10/05/2010. Megace was initiated on 10/05/2010.  -A. restaging CT 01/29/2011 showed stable right axillary lymphadenopathy -he began palliative radiation to the right axillary line mass on 07/25/2011, completed 08/26/2011. The right axillary mass has resolved.  2. Remote  history of bilateral breast cancer, diagnosis of multinode positive right-sided breast cancer in 1996, status with adjuvant CMF chemotherapy and tamoxifen. Status post bilateral mastectomies.  3. History of coronary artery  4. Hypertension  5. Hyperlipidemia  6. history of mild anemia  7. Chronic renal insufficiency  8. Lipoma of the left arm  9. History of arthralgias secondary to degenerative arthritis  10. Chronic bradycardia  11. Left leg pain. A plain x-ray evaluation of the lumbosacral spine left leg was negative for evidence of metastatic disease in March of 2012. A bone scan was also negative. The pain resolved.  12. Status post right knee replacement 2013 , left knee replacement 2014    Disposition:  There is no clinical evidence for progression of breast cancer. He received an influenza vaccine today. Mr. Hopfensperger will return for an office visit in 4 months.  Betsy Coder, MD  04/04/2014  3:20 PM

## 2014-08-01 ENCOUNTER — Ambulatory Visit (HOSPITAL_BASED_OUTPATIENT_CLINIC_OR_DEPARTMENT_OTHER): Payer: Medicare Other | Admitting: Oncology

## 2014-08-01 ENCOUNTER — Telehealth: Payer: Self-pay | Admitting: Oncology

## 2014-08-01 VITALS — BP 146/62 | HR 56 | Temp 97.0°F | Resp 18 | Ht 70.0 in | Wt 138.4 lb

## 2014-08-01 DIAGNOSIS — C50919 Malignant neoplasm of unspecified site of unspecified female breast: Secondary | ICD-10-CM

## 2014-08-01 DIAGNOSIS — R600 Localized edema: Secondary | ICD-10-CM

## 2014-08-01 DIAGNOSIS — Z853 Personal history of malignant neoplasm of breast: Secondary | ICD-10-CM

## 2014-08-01 NOTE — Telephone Encounter (Signed)
Gave avs & calendar for May. °

## 2014-08-01 NOTE — Progress Notes (Signed)
  [place] OFFICE PROGRESS NOTE   Diagnosis: Breast cancer  INTERVAL HISTORY:   Mr. Derek Flores returns as scheduled. Mild discomfort at the right iliac intermittently. Stable exertional dyspnea. No change over the chest wall. He noted mild swelling at the right lower arm today. He reports recent changes in his blood pressure and diuretic regimen by his primary physician.  Objective:  Vital signs in last 24 hours:  Blood pressure 146/62, pulse 56, temperature 97 F (36.1 C), temperature source Oral, resp. rate 18, height 5\' 10"  (1.778 m), weight 138 lb 6.4 oz (62.778 kg), SpO2 97 %.    HEENT: Neck without mass Lymphatics: No cervical, supraclavicular, or axillary nodes Resp: Lungs with inspiratory rhonchi at the lower posterior chest bilaterally, no respiratory distress Cardio: Regular rate and rhythm GI: No hepatomegaly Vascular: Trace edema at the left greater than right lower leg. Trace edema throughout the right lower arm     Lab Results:  Lab Results  Component Value Date   WBC 7.6 04/05/2011   HGB 11.5* 04/05/2011   HCT 33.4* 04/05/2011   MCV 94.5 04/05/2011   PLT 211 04/05/2011   NEUTROABS 4.4 04/05/2011     Medications: I have reviewed the patient's current medications.  Assessment/Plan: 1.Metastatic breast cancer involving a right axillary subcutaneous mass and mediastinal lymphadenopathy. A fine-needle aspiration of the mass was consistent with metastatic breast cancer. The tumor was ER/PR positive. A staging PET scan revealed hypermetabolic lymph nodes in the right axilla and mediastinum.  -Arimidex was initiated after an office visit 12/11/2008.  -A. restaging CT on 06/19/2010 revealed a slight decrease the right axillary lymphadenopathy compared to the PET scan from June of 2000 and and no evidence for progressive metastatic disease.  -The right-sided mass appeared larger with satellite lesions on exam 10/05/2010. Megace was initiated on  10/05/2010.  -A. restaging CT 01/29/2011 showed stable right axillary lymphadenopathy -he began palliative radiation to the right axillary line mass on 07/25/2011, completed 08/26/2011. The right axillary mass has resolved.  2. Remote history of bilateral breast cancer, diagnosis of multinode positive right-sided breast cancer in 1996, status with adjuvant CMF chemotherapy and tamoxifen. Status post bilateral mastectomies.  3. History of coronary artery  4. Hypertension  5. Hyperlipidemia  6. history of mild anemia  7. Chronic renal insufficiency  8. Lipoma of the left arm  9. History of arthralgias secondary to degenerative arthritis  10. Chronic bradycardia  11. Left leg pain. A plain x-ray evaluation of the lumbosacral spine left leg was negative for evidence of metastatic disease in March of 2012. A bone scan was also negative. The pain resolved.  12. Status post right knee replacement 2013 , left knee replacement 2014   Disposition:  Mr. Laura appears stable. 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 There is no clinical evidence for progression of breast cancer. We will continue an observation approach. He will return for an office visit in 4 months.  Betsy Coder, MD  08/01/2014  4:20 PM

## 2014-11-28 ENCOUNTER — Ambulatory Visit (HOSPITAL_BASED_OUTPATIENT_CLINIC_OR_DEPARTMENT_OTHER): Payer: Medicare Other | Admitting: Oncology

## 2014-11-28 ENCOUNTER — Telehealth: Payer: Self-pay | Admitting: Oncology

## 2014-11-28 VITALS — BP 156/65 | HR 50 | Temp 97.6°F | Resp 17 | Ht 70.0 in | Wt 148.3 lb

## 2014-11-28 DIAGNOSIS — R600 Localized edema: Secondary | ICD-10-CM

## 2014-11-28 DIAGNOSIS — Z853 Personal history of malignant neoplasm of breast: Secondary | ICD-10-CM | POA: Diagnosis not present

## 2014-11-28 DIAGNOSIS — C50919 Malignant neoplasm of unspecified site of unspecified female breast: Secondary | ICD-10-CM

## 2014-11-28 NOTE — Telephone Encounter (Signed)
Pt confirmed MD visit per 05/23 POF, gave pt AVS and Calendar.... KJ

## 2014-11-28 NOTE — Progress Notes (Signed)
  [place] OFFICE PROGRESS NOTE   Diagnosis: Breast cancer  INTERVAL HISTORY:   Derek Flores returns as scheduled. He reports being admitted to [facility] with "pneumonia "approximately 1 month ago. He now has stable exertional dyspnea. Stable right arm edema that improves with a lymphedema sleeve. No other complaint.  Objective:  Vital signs in last 24 hours:  Blood pressure 156/65, pulse 50, temperature 97.6 F (36.4 C), temperature source Oral, resp. rate 17, height 5\' 10"  (1.778 m), weight 148 lb 4.8 oz (67.268 kg), SpO2 94 %.    HEENT: Neck without mass Lymphatics: No cervical, supraclavicular, or axillary nodes Resp: Inspiratory rhonchi at the left greater than right posterior chest, no respiratory distress Cardio: Regular rate and rhythm GI: No hepatomegaly Vascular: 1+ pitting edema at the left greater than right lower leg, trace edema at the right lower arm Breasts: Status post bilateral mastectomy. No chest wall nodules. Radiation telangiectasias at the right lateral chest wall/axilla Skin: Soft mobile less than 1 cm oval cutaneous nodule at the right anterior mid arm  Medications: I have reviewed the patient's current medications.  Assessment/Plan: 1.Metastatic breast cancer involving a right axillary subcutaneous mass and mediastinal lymphadenopathy. A fine-needle aspiration of the mass was consistent with metastatic breast cancer. The tumor was ER/PR positive. A staging PET scan revealed hypermetabolic lymph nodes in the right axilla and mediastinum.  -Arimidex was initiated after an office visit 12/11/2008.  -A. restaging CT on 06/19/2010 revealed a slight decrease the right axillary lymphadenopathy compared to the PET scan from June of 2000 and and no evidence for progressive metastatic disease.  -The right-sided mass appeared larger with satellite lesions on exam 10/05/2010. Megace was initiated on 10/05/2010.  -A. restaging CT  01/29/2011 showed stable right axillary lymphadenopathy -he began palliative radiation to the right axillary line mass on 07/25/2011, completed 08/26/2011. The right axillary mass has resolved.  2. Remote history of bilateral breast cancer, diagnosis of multinode positive right-sided breast cancer in 1996, status with adjuvant CMF chemotherapy and tamoxifen. Status post bilateral mastectomies.  3. History of coronary artery  4. Hypertension  5. Hyperlipidemia  6. history of mild anemia  7. Chronic renal insufficiency  8. Lipoma of the left arm  9. History of arthralgias secondary to degenerative arthritis  10. Chronic bradycardia  11. Left leg pain. A plain x-ray evaluation of the lumbosacral spine left leg was negative for evidence of metastatic disease in March of 2012. A bone scan was also negative. The pain resolved.  12. Status post right knee replacement 2013 , left knee replacement 2014  13. Edema of the right lower arm-likely lymphedema from the right mastectomy and axillary radiation    Disposition:  There is no clinical evidence for progression of breast cancer. The right arm edema is most likely related to right axillary radiation. He will contact us if this worsens and we will consider obtaining a restaging CT of the chest to rule out venous or lymphatic obstruction.  Derek Flores will return for an office visit in 4 months.  Derek Coder, MD  11/28/2014  10:07 AM

## 2014-12-18 ENCOUNTER — Emergency Department (HOSPITAL_COMMUNITY): Payer: Medicare Other

## 2014-12-18 ENCOUNTER — Encounter (HOSPITAL_COMMUNITY): Payer: Self-pay | Admitting: Physical Medicine and Rehabilitation

## 2014-12-18 ENCOUNTER — Inpatient Hospital Stay (HOSPITAL_COMMUNITY)
Admission: EM | Admit: 2014-12-18 | Discharge: 2014-12-20 | DRG: 061 | Disposition: A | Discharge status: Home / Self Care | Payer: Medicare Other | Attending: Neurology | Admitting: Neurology

## 2014-12-18 DIAGNOSIS — R001 Bradycardia, unspecified: Secondary | ICD-10-CM | POA: Diagnosis present

## 2014-12-18 DIAGNOSIS — E785 Hyperlipidemia, unspecified: Secondary | ICD-10-CM | POA: Diagnosis present

## 2014-12-18 DIAGNOSIS — N183 Chronic kidney disease, stage 3 unspecified: Secondary | ICD-10-CM | POA: Diagnosis present

## 2014-12-18 DIAGNOSIS — I63412 Cerebral infarction due to embolism of left middle cerebral artery: Secondary | ICD-10-CM | POA: Diagnosis present

## 2014-12-18 DIAGNOSIS — Z955 Presence of coronary angioplasty implant and graft: Secondary | ICD-10-CM

## 2014-12-18 DIAGNOSIS — I618 Other nontraumatic intracerebral hemorrhage: Secondary | ICD-10-CM | POA: Diagnosis not present

## 2014-12-18 DIAGNOSIS — N400 Enlarged prostate without lower urinary tract symptoms: Secondary | ICD-10-CM | POA: Diagnosis present

## 2014-12-18 DIAGNOSIS — Z87891 Personal history of nicotine dependence: Secondary | ICD-10-CM | POA: Diagnosis not present

## 2014-12-18 DIAGNOSIS — Z803 Family history of malignant neoplasm of breast: Secondary | ICD-10-CM

## 2014-12-18 DIAGNOSIS — C771 Secondary and unspecified malignant neoplasm of intrathoracic lymph nodes: Secondary | ICD-10-CM | POA: Diagnosis present

## 2014-12-18 DIAGNOSIS — T45615A Adverse effect of thrombolytic drugs, initial encounter: Secondary | ICD-10-CM | POA: Diagnosis not present

## 2014-12-18 DIAGNOSIS — I129 Hypertensive chronic kidney disease with stage 1 through stage 4 chronic kidney disease, or unspecified chronic kidney disease: Secondary | ICD-10-CM | POA: Diagnosis present

## 2014-12-18 DIAGNOSIS — R4701 Aphasia: Secondary | ICD-10-CM | POA: Diagnosis present

## 2014-12-18 DIAGNOSIS — C7989 Secondary malignant neoplasm of other specified sites: Secondary | ICD-10-CM | POA: Diagnosis present

## 2014-12-18 DIAGNOSIS — Z9221 Personal history of antineoplastic chemotherapy: Secondary | ICD-10-CM | POA: Diagnosis not present

## 2014-12-18 DIAGNOSIS — R252 Cramp and spasm: Secondary | ICD-10-CM | POA: Diagnosis present

## 2014-12-18 DIAGNOSIS — I495 Sick sinus syndrome: Secondary | ICD-10-CM | POA: Diagnosis present

## 2014-12-18 DIAGNOSIS — Z974 Presence of external hearing-aid: Secondary | ICD-10-CM | POA: Diagnosis not present

## 2014-12-18 DIAGNOSIS — I6789 Other cerebrovascular disease: Secondary | ICD-10-CM | POA: Diagnosis not present

## 2014-12-18 DIAGNOSIS — I480 Paroxysmal atrial fibrillation: Secondary | ICD-10-CM | POA: Diagnosis present

## 2014-12-18 DIAGNOSIS — J449 Chronic obstructive pulmonary disease, unspecified: Secondary | ICD-10-CM | POA: Diagnosis present

## 2014-12-18 DIAGNOSIS — Z8042 Family history of malignant neoplasm of prostate: Secondary | ICD-10-CM | POA: Diagnosis not present

## 2014-12-18 DIAGNOSIS — Z801 Family history of malignant neoplasm of trachea, bronchus and lung: Secondary | ICD-10-CM

## 2014-12-18 DIAGNOSIS — Z853 Personal history of malignant neoplasm of breast: Secondary | ICD-10-CM | POA: Diagnosis not present

## 2014-12-18 DIAGNOSIS — I639 Cerebral infarction, unspecified: Secondary | ICD-10-CM | POA: Diagnosis present

## 2014-12-18 DIAGNOSIS — I2510 Atherosclerotic heart disease of native coronary artery without angina pectoris: Secondary | ICD-10-CM | POA: Diagnosis present

## 2014-12-18 DIAGNOSIS — H9193 Unspecified hearing loss, bilateral: Secondary | ICD-10-CM | POA: Diagnosis present

## 2014-12-18 DIAGNOSIS — R1310 Dysphagia, unspecified: Secondary | ICD-10-CM | POA: Diagnosis present

## 2014-12-18 DIAGNOSIS — I619 Nontraumatic intracerebral hemorrhage, unspecified: Secondary | ICD-10-CM | POA: Insufficient documentation

## 2014-12-18 DIAGNOSIS — E039 Hypothyroidism, unspecified: Secondary | ICD-10-CM | POA: Diagnosis present

## 2014-12-18 DIAGNOSIS — I10 Essential (primary) hypertension: Secondary | ICD-10-CM | POA: Diagnosis present

## 2014-12-18 DIAGNOSIS — Z9013 Acquired absence of bilateral breasts and nipples: Secondary | ICD-10-CM | POA: Diagnosis present

## 2014-12-18 DIAGNOSIS — Z7982 Long term (current) use of aspirin: Secondary | ICD-10-CM | POA: Diagnosis not present

## 2014-12-18 DIAGNOSIS — Z823 Family history of stroke: Secondary | ICD-10-CM | POA: Diagnosis not present

## 2014-12-18 DIAGNOSIS — Z923 Personal history of irradiation: Secondary | ICD-10-CM | POA: Diagnosis not present

## 2014-12-18 DIAGNOSIS — D649 Anemia, unspecified: Secondary | ICD-10-CM | POA: Diagnosis present

## 2014-12-18 LAB — COMPREHENSIVE METABOLIC PANEL
ALT: 13 U/L — ABNORMAL LOW (ref 17–63)
ANION GAP: 7 (ref 5–15)
AST: 19 U/L (ref 15–41)
Albumin: 3.6 g/dL (ref 3.5–5.0)
Alkaline Phosphatase: 59 U/L (ref 38–126)
BUN: 35 mg/dL — ABNORMAL HIGH (ref 6–20)
CHLORIDE: 112 mmol/L — AB (ref 101–111)
CO2: 22 mmol/L (ref 22–32)
Calcium: 9.2 mg/dL (ref 8.9–10.3)
Creatinine, Ser: 1.82 mg/dL — ABNORMAL HIGH (ref 0.61–1.24)
GFR calc Af Amer: 35 mL/min — ABNORMAL LOW (ref >60)
GFR calc non Af Amer: 31 mL/min — ABNORMAL LOW (ref >60)
Glucose, Bld: 135 mg/dL — ABNORMAL HIGH (ref 65–99)
POTASSIUM: 4.3 mmol/L (ref 3.5–5.1)
Sodium: 141 mmol/L (ref 135–145)
Total Bilirubin: 0.5 mg/dL (ref 0.3–1.2)
Total Protein: 6.6 g/dL (ref 6.5–8.1)

## 2014-12-18 LAB — CBC
HCT: 32 % — ABNORMAL LOW (ref 39.0–52.0)
Hemoglobin: 10.7 g/dL — ABNORMAL LOW (ref 13.0–17.0)
MCH: 31.4 pg (ref 26.0–34.0)
MCHC: 33.4 g/dL (ref 30.0–36.0)
MCV: 93.8 fL (ref 78.0–100.0)
PLATELETS: 179 10*3/uL (ref 150–400)
RBC: 3.41 MIL/uL — ABNORMAL LOW (ref 4.22–5.81)
RDW: 13.5 % (ref 11.5–15.5)
WBC: 6.3 10*3/uL (ref 4.0–10.5)

## 2014-12-18 LAB — DIFFERENTIAL
Basophils Absolute: 0 10*3/uL (ref 0.0–0.1)
Basophils Relative: 0 % (ref 0–1)
Eosinophils Absolute: 0.2 10*3/uL (ref 0.0–0.7)
Eosinophils Relative: 3 % (ref 0–5)
Lymphocytes Relative: 31 % (ref 12–46)
Lymphs Abs: 1.9 10*3/uL (ref 0.7–4.0)
Monocytes Absolute: 0.4 10*3/uL (ref 0.1–1.0)
Monocytes Relative: 7 % (ref 3–12)
Neutro Abs: 3.7 10*3/uL (ref 1.7–7.7)
Neutrophils Relative %: 59 % (ref 43–77)

## 2014-12-18 LAB — GLUCOSE, CAPILLARY
Glucose-Capillary: 109 mg/dL — ABNORMAL HIGH (ref 65–99)
Glucose-Capillary: 110 mg/dL — ABNORMAL HIGH (ref 65–99)

## 2014-12-18 LAB — I-STAT TROPONIN, ED: Troponin i, poc: 0.02 ng/mL (ref 0.00–0.08)

## 2014-12-18 LAB — PROTIME-INR
INR: 1.05 (ref 0.00–1.49)
Prothrombin Time: 13.9 seconds (ref 11.6–15.2)

## 2014-12-18 LAB — APTT: APTT: 31 s (ref 24–37)

## 2014-12-18 LAB — MRSA PCR SCREENING: MRSA by PCR: NEGATIVE

## 2014-12-18 MED ORDER — SENNOSIDES-DOCUSATE SODIUM 8.6-50 MG PO TABS
1.0000 | ORAL_TABLET | Freq: Every evening | ORAL | Status: DC | PRN
  Filled 2014-12-18: qty 1

## 2014-12-18 MED ORDER — ACETAMINOPHEN 650 MG RE SUPP: 650.0000 mg | RECTAL | Status: DC | PRN

## 2014-12-18 MED ORDER — STROKE: EARLY STAGES OF RECOVERY BOOK
Freq: Once | Status: AC
  Administered 2014-12-18: 16:00:00
  Filled 2014-12-18: qty 1

## 2014-12-18 MED ORDER — NICARDIPINE HCL IN NACL 20-0.86 MG/200ML-% IV SOLN: 3.0000 mg/h | INTRAVENOUS | Status: DC

## 2014-12-18 MED ORDER — ACETAMINOPHEN 325 MG PO TABS
650.0000 mg | ORAL_TABLET | ORAL | Status: DC | PRN
  Filled 2014-12-18: qty 2

## 2014-12-18 MED ORDER — SODIUM CHLORIDE 0.9 % IV SOLN
INTRAVENOUS | Status: DC
  Administered 2014-12-18: 20:00:00 via INTRAVENOUS

## 2014-12-18 MED ORDER — METHOCARBAMOL 1000 MG/10ML IJ SOLN
500.0000 mg | Freq: Four times a day (QID) | INTRAVENOUS | Status: DC | PRN
  Administered 2014-12-18: 500 mg via INTRAVENOUS
  Filled 2014-12-18 (×3): qty 5

## 2014-12-18 MED ORDER — ALTEPLASE (STROKE) FULL DOSE INFUSION
0.9000 mg/kg | Freq: Once | INTRAVENOUS | Status: AC
  Administered 2014-12-18: 60 mg via INTRAVENOUS
  Filled 2014-12-18: qty 60

## 2014-12-18 MED ORDER — PANTOPRAZOLE SODIUM 40 MG IV SOLR
40.0000 mg | Freq: Every day | INTRAVENOUS | Status: DC
  Administered 2014-12-18 – 2014-12-19 (×2): 40 mg via INTRAVENOUS
  Filled 2014-12-18 (×3): qty 40

## 2014-12-18 NOTE — ED Provider Notes (Signed)
CSN: [id number]     Arrival date & time 12/18/14  33 History   First MD Initiated Contact with Patient 12/18/14 1409     Chief Complaint  Patient presents with  . Code Stroke     (Consider location/radiation/quality/duration/timing/severity/associated sxs/prior Treatment) The history is provided by the patient and the EMS personnel. The history is limited by the condition of the patient.  Patient arrives via ems with inability to speak onset approximately 1 pm today.  EMS reports pt had spoken w family on phone sometime just prior to 1 pm and seemed normal, at baseline.  Pt subsequently noted w inability to communicate other than occasional yes/no, and ?some difficulty swallowing. Pt very limited historian given expressive asphasia - level 5 caveat.       Past Medical History  Diagnosis Date  . Cancer   . Coronary artery disease   . Dyslipidemia   . Chronic kidney disease     chronic  . Bradycardia     chronic  . Hypertension   . Anemia 12/21/2008  . Lipoma     L arm  . Arthritis     degenerative, arthralgias  . Breast cancer 01/02/1995, 11/07/1995     r breast '96, l breast '97  . Prostate cancer     pt denies prostate cancer  . Hx of radiation therapy 07/25/11 to 08/26/11    palliative, r axilla   Past Surgical History  Procedure Laterality Date  . Mastectomy      hx bilat mastectomies, R 1996, L 1997  . Coronary stent placement  2008  . Cataract extraction  1998    bilat   Family History  Problem Relation Age of Onset  . Cancer Father     prostate  . Cancer Sister     breast   History  Substance Use Topics  . Smoking status: Former Smoker -- 1.00 packs/day for 50 years    Types: Cigarettes    Quit date: 07/09/1980  . Smokeless tobacco: Not on file  . Alcohol Use: No       Review of Systems  Unable to perform ROS: Patient nonverbal  level 5 caveat, stroke, expressive aphasia      Allergies  Review of patient's allergies indicates no known  allergies.  Home Medications   Prior to Admission medications   Medication Sig Start Date End Date Taking? Authorizing Provider  acetaminophen (TYLENOL) 500 MG tablet Take 1,000 mg by mouth daily.    Historical Provider, MD  amLODipine (NORVASC) 5 MG tablet Take 5 mg by mouth daily.  06/17/11   Historical Provider, MD  aspirin 81 MG tablet Take 81 mg by mouth daily.      Historical Provider, MD  CRESTOR 20 MG tablet Take 20 mg by mouth daily.  05/01/11   Historical Provider, MD  cyanocobalamin 1000 MCG tablet Take 1,000 mcg by mouth daily.     Historical Provider, MD  diphenhydramine-acetaminophen (TYLENOL PM) 25-500 MG TABS Take 1 tablet by mouth at bedtime as needed.    Historical Provider, MD  hydrochlorothiazide (HYDRODIURIL) 25 MG tablet Take 25 mg by mouth. 12/17/13 12/17/14  Historical Provider, MD  levothyroxine (SYNTHROID, LEVOTHROID) 25 MCG tablet Take 25 mcg by mouth.    Historical Provider, MD  NITROSTAT 0.4 MG SL tablet  05/23/14   Historical Provider, MD  tamsulosin (FLOMAX) 0.4 MG CAPS Take 0.4 mg by mouth daily. 10/28/12   Historical Provider, MD   BP 163/95 mmHg  Pulse 53  Temp(Src) 97.9 F (36.6 C) (Oral)  Resp 20  Wt 148 lb (67.132 kg)  SpO2 98% Physical Exam  Constitutional: He appears well-developed and well-nourished. No distress.  HENT:  Head: Atraumatic.  Mouth/Throat: Oropharynx is clear and moist.  Eyes: Conjunctivae are normal. Pupils are equal, round, and reactive to light.  Neck: Neck supple. No tracheal deviation present.  No bruit  Cardiovascular: Normal rate, normal heart sounds and intact distal pulses.  Exam reveals no gallop and no friction rub.   No murmur heard. Pulmonary/Chest: Effort normal and breath sounds normal. No accessory muscle usage. No respiratory distress.  Abdominal: Soft. Bowel sounds are normal. He exhibits no distension. There is no tenderness.  Genitourinary:  No flank tenderness.   Musculoskeletal: Normal range of motion. He  exhibits no edema or tenderness.  Neurological: He is alert.  Awake and alert appearing.   Smiles. Occasionally will answer (garbled) yes/no to question. Expressive aphasia. Moves bil ext purposefully w good strength, stre 5/5.   Skin: Skin is warm and dry. He is not diaphoretic.  Psychiatric: He has a normal mood and affect.  Nursing note and vitals reviewed.   ED Course  Procedures (including critical care time) Labs Review  Results for orders placed or performed during the hospital encounter of 12/18/14  Protime-INR  Result Value Ref Range   Prothrombin Time 13.9 11.6 - 15.2 seconds   INR 1.05 0.00 - 1.49  APTT  Result Value Ref Range   aPTT 31 24 - 37 seconds  CBC  Result Value Ref Range   WBC 6.3 4.0 - 10.5 K/uL   RBC 3.41 (L) 4.22 - 5.81 MIL/uL   Hemoglobin 10.7 (L) 13.0 - 17.0 g/dL   HCT 32.0 (L) 39.0 - 52.0 %   MCV 93.8 78.0 - 100.0 fL   MCH 31.4 26.0 - 34.0 pg   MCHC 33.4 30.0 - 36.0 g/dL   RDW 13.5 11.5 - 15.5 %   Platelets 179 150 - 400 K/uL  Differential  Result Value Ref Range   Neutrophils Relative % 59 43 - 77 %   Neutro Abs 3.7 1.7 - 7.7 K/uL   Lymphocytes Relative 31 12 - 46 %   Lymphs Abs 1.9 0.7 - 4.0 K/uL   Monocytes Relative 7 3 - 12 %   Monocytes Absolute 0.4 0.1 - 1.0 K/uL   Eosinophils Relative 3 0 - 5 %   Eosinophils Absolute 0.2 0.0 - 0.7 K/uL   Basophils Relative 0 0 - 1 %   Basophils Absolute 0.0 0.0 - 0.1 K/uL  I-stat troponin, ED (not at [facility])  Result Value Ref Range   Troponin i, poc 0.02 0.00 - 0.08 ng/mL   Comment 3           Ct Head Wo Contrast  12/18/2014   CLINICAL DATA:  Difficulty swallowing.  Unable to follow commands.  EXAM: CT HEAD WITHOUT CONTRAST  TECHNIQUE: Contiguous axial images were obtained from the base of the skull through the vertex without intravenous contrast.  COMPARISON:  12/15/2008  FINDINGS: Sinuses/Soft tissues: Clear paranasal sinuses and mastoid air cells.  Intracranial: Mild for age low density in the  periventricular white matter likely related to small vessel disease. Expected cerebral volume loss for age. Cerebellar atrophy is also likely age-appropriate. Vertebral and carotid atherosclerosis bilaterally. No mass lesion, hemorrhage, hydrocephalus, acute infarct, intra-axial, or extra-axial fluid collection.  IMPRESSION: 1.  No acute intracranial abnormality. 2.  Cerebral atrophy and small vessel ischemic change.  These results were called by telephone at the time of interpretation on 12/18/2014 at 2:24 pm to Dr. Doy Mince, who verbally acknowledged these results.   Electronically Signed   By: Abigail Miyamoto M.D.   On: 12/18/2014 14:39       EKG Interpretation   Date/Time:  Sunday December 18 2014 14:20:13 EDT Ventricular Rate:  83 PR Interval:  168 QRS Duration: 98 QT Interval:  421 QTC Calculation: 495 R Axis:   35 Text Interpretation:  Sinus bradycardia Multiform ventricular premature  complexes No previous tracing Confirmed by Ashok Cordia  MD, Lennette Bihari ([id number]) on  12/18/2014 2:27:31 PM      MDM   Iv ns. Code stroke called from bridge on pt arrival to ED.  Stat ct.  Neurology eval in ED.  Reviewed nursing notes and prior charts for additional history.   Recheck no change in exam from initial.  Neurology is giving pt tpa/admitting.      Lajean Saver, MD 12/18/14 [phone]

## 2014-12-18 NOTE — H&P (Signed)
Referring Physician: Dr Ashok Cordia    Chief Complaint: Code stroke  HPI: Derek Flores is an active 81 y.o. 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 The son reported that the patient was able to walk but he could not speak. He also appeared to be having difficulty swallowing. No facial droop or unilateral weakness was noted.  Onset of symptoms was estimated to be 1315. EMS was summoned. The patient was able to get on the stretcher under his own power. He was brought to [facility] ED as a code stroke. A CT scan on arrival was negative for acute changes. Dr. Doy Mince examined the patient and noted an NIH score of 6. She spoke with the family regarding the risks as well as the benefits of giving TPA and a decision is made to proceed with treatment. TPA started at 1437.   Date last known well: Date: 12/18/2014 Time last known well: Time: 13:15 tPA Given: Yes  Past Medical History  Diagnosis Date  . Cancer   . Coronary artery disease   . Dyslipidemia   . Chronic kidney disease     chronic  . Bradycardia     chronic  . Hypertension   . Anemia 12/21/2008  . Lipoma     L arm  . Arthritis     degenerative, arthralgias  . Breast cancer 01/02/1995, 11/07/1995     r breast '96, l breast '97  . Prostate cancer     pt denies prostate cancer  . Hx of radiation therapy 07/25/11 to 08/26/11    palliative, r axilla    Past Surgical History  Procedure Laterality Date  . Mastectomy      hx bilat mastectomies, R 1996, L 1997  . Coronary stent placement  2008  . Cataract extraction  1998    bilat    Family History  Problem Relation Age of Onset  . Cancer Father     prostate  . Cancer Sister     breast   Family history - as noted above. Additional information: His grandfather had lung cancer. His mother had 5-6 strokes.   Social History:  reports that he quit smoking about 34 years ago. His smoking use included Cigarettes. He has a 50 pack-year  smoking history. He does not have any smokeless tobacco history on file. He reports that he does not drink alcohol or use illicit drugs. 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 He lives alone in [place]. He stays active and still drives.  Allergies: No Known Allergies  Medications:  Current Facility-Administered Medications  Medication Dose Route Frequency Provider Last Rate Last Dose  . alteplase (ACTIVASE) 1 mg/mL infusion 60 mg  0.9 mg/kg Intravenous Once Alexis Goodell, MD 60 mL/hr at 12/18/14 1437 60 mg at 12/18/14 1437   I have reviewed the patient's current medications.  ROS: History obtained from The patient's family members  General ROS: negative for - chills, fatigue, fever, night sweats, weight gain or weight loss. Positive for occasional mild confusion. Psychological ROS: negative for - behavioral disorder, hallucinations, memory difficulties, mood swings or suicidal ideation Ophthalmic ROS: negative for - blurry vision, double vision, eye pain or loss of vision ENT ROS: negative for - epistaxis, nasal discharge, oral lesions, sore throat, tinnitus or vertigo Allergy and Immunology ROS: negative for - hives or itchy/watery eyes Hematological and Lymphatic ROS: negative for - bleeding problems, bruising or swollen lymph  nodes Endocrine ROS: negative for - galactorrhea, hair pattern changes, polydipsia/polyuria or temperature intolerance Respiratory ROS: negative for - cough, hemoptysis, or wheezing. Positive for recent pneumonia and shortness of breath. Cardiovascular ROS: negative for - chest pain, dyspnea on exertion, edema or irregular heartbeat Gastrointestinal ROS: negative for - abdominal pain, diarrhea, hematemesis, nausea/vomiting or stool incontinence Genito-Urinary ROS: negative for - dysuria, hematuria, incontinence or urinary frequency/urgency Musculoskeletal ROS: negative for - joint swelling or muscular weakness Neurological ROS: as  noted in HPI Dermatological ROS: negative for rash and skin lesion changes    Physical Examination: Blood pressure 145/68, pulse 34, temperature 97.9 F (36.6 C), temperature source Oral, resp. rate 16, weight 67.132 kg (148 lb), SpO2 98 %.  HEENT-  Normocephalic, no lesions, without obvious abnormality.  Normal external eye and conjunctiva.  Normal TM's bilaterally.  Normal auditory canals and external ears. Normal external nose, mucus membranes and septum.  Normal pharynx. Cardiovascular- S1, S2 normal, pulses palpable throughout   Lungs- chest clear, no wheezing, rales, normal symmetric air entry Abdomen- soft, non-tender; bowel sounds normal; no masses,  no organomegaly Extremities- lower extremity edema bilaterally, left greater than right Lymph-no adenopathy palpable Musculoskeletal-no joint tenderness, deformity or swelling Skin-warm and dry, no hyperpigmentation, vitiligo, or suspicious lesions  Neurological Examination Mental Status: Alert.  Speech garbled and unintelligible.  Able to understand most conversation but does have difficulty with commands like finger-to-nose, etc.    Cranial Nerves: II: Discs flat bilaterally; Visual fields grossly normal, pupils equal, round, reactive to light and accommodation III,IV, VI: ptosis not present, extra-ocular motions intact bilaterally V,VII: mild decrease in right NLF, facial light touch sensation normal bilaterally VIII: hearing normal bilaterally IX,X: gag reflex reduced XI: bilateral shoulder shrug XII: midline tongue extension Motor: Right : Upper extremity   5/5    Left:     Upper extremity   5/5  Lower extremity   5/5     Lower extremity   5/5 Tone and bulk:normal tone throughout; no atrophy noted Sensory: Pinprick and light touch intact throughout, bilaterally Deep Tendon Reflexes: 2+ and symmetric with absent AJ's bilaterally Plantars: Right: downgoing   Left: downgoing Cerebellar: normal finger-to-nose and normal  heel-to-shin testing bilaterally Gait: not tested due to ability to follow commands.        Laboratory Studies:  Basic Metabolic Panel:  Recent Labs Lab 12/18/14 1419  NA 141  K 4.3  CL 112*  CO2 22  GLUCOSE 135*  BUN 35*  CREATININE 1.82*  CALCIUM 9.2    Liver Function Tests:  Recent Labs Lab 12/18/14 1419  AST 19  ALT 13*  ALKPHOS 59  BILITOT 0.5  PROT 6.6  ALBUMIN 3.6   No results for input(s): LIPASE, AMYLASE in the last 168 hours. No results for input(s): AMMONIA in the last 168 hours.  CBC:  Recent Labs Lab 12/18/14 1419  WBC 6.3  NEUTROABS 3.7  HGB 10.7*  HCT 32.0*  MCV 93.8  PLT 179    Cardiac Enzymes: No results for input(s): CKTOTAL, CKMB, CKMBINDEX, TROPONINI in the last 168 hours.  BNP: Invalid input(s): POCBNP  CBG: No results for input(s): GLUCAP in the last 168 hours.  Microbiology: No results found for this or any previous visit.  Coagulation Studies:  Recent Labs  12/18/14 1419  LABPROT 13.9  INR 1.05    Urinalysis: No results for input(s): COLORURINE, LABSPEC, PHURINE, GLUCOSEU, HGBUR, BILIRUBINUR, KETONESUR, PROTEINUR, UROBILINOGEN, NITRITE, LEUKOCYTESUR in the last 168 hours.  Invalid input(s): APPERANCEUR  Lipid Panel: No results found for: CHOL, TRIG, HDL, CHOLHDL, VLDL, LDLCALC  HgbA1C: No results found for: HGBA1C  Urine Drug Screen:  No results found for: LABOPIA, COCAINSCRNUR, LABBENZ, AMPHETMU, THCU, LABBARB  Alcohol Level: No results for input(s): ETH in the last 168 hours.  Other results: EKG: Sinus bradycardia with PVCs. Please refer to the formal cardiology reading for complete details  Imaging:  Ct Head Wo Contrast 12/18/2014    1.  No acute intracranial abnormality.  2.  Cerebral atrophy and small vessel ischemic change.    Mikey Bussing PA-C [facility] Pager [phone] 12/18/2014, 3:31 PM    Patient seen and examined.  Clinical course and management discussed.   Necessary edits performed.  I agree with the above.  Assessment and plan of care developed and discussed below.    Assessment: 81 y.o. male who lives alone, still drives, and is very active on his farm had acute onset of aphasia at approximately 1315 today.  On presentation with aphasia and a mild facial droop on neurological examination.  Head CT reviewed and shows no acute changes.  Contraindications reviewed.  Risks and benefits discussed with family and verbal consent obtained from son.  tPA administered with no initial complications.       Stroke Risk Factors - family history, hyperlipidemia, hypertension and Coronary artery disease  Plan: 1. HgbA1c, fasting lipid panel 2. MRI, MRA  of the brain without contrast 3. PT consult, OT consult, Speech consult 4. Echocardiogram 5. Carotid dopplers 6. Prophylactic therapy-no antithrombotics for now secondary to TPA therapy. 7. NPO until RN stroke swallow screen 8. Telemetry monitoring 9. Frequent neuro checks 10. Repeat head CT in 24 hours   This patient is critically ill and at significant risk of neurological worsening, death and care requires constant monitoring of vital signs, hemodynamics,respiratory and cardiac monitoring, neurological assessment, discussion with family, other specialists and medical decision making of high complexity. I spent 45 minutes of neurocritical care time  in the care of  this patient.  Alexis Goodell, MD [facility] [phone] 12/18/2014  3:48 PM

## 2014-12-18 NOTE — Progress Notes (Signed)
Code stroke called at 1412.  Patient arrived to [facility] via [facility] at 1400.  AS per report, patient was LSN 1300 when son spoke to him on the phone, patient drove himself to sons house and arrived at 45 and son noticed patient could not speak clearly and having garbled speech.  NIHSS 6.  Pharmacy notified of TPA at 1424, Tpa delivered at 1433, TPA started at 1437. 3MW bed ordered

## 2014-12-18 NOTE — ED Notes (Signed)
Attempted to call report. Nurse to call back. 

## 2014-12-18 NOTE — ED Notes (Signed)
Pt presents to department for evaluation of possible stroke. LSN: 1pm by family. Upon arrival garbled speech and difficulty swallowing noted.

## 2014-12-19 ENCOUNTER — Inpatient Hospital Stay (HOSPITAL_COMMUNITY): Payer: Medicare Other

## 2014-12-19 DIAGNOSIS — R4701 Aphasia: Secondary | ICD-10-CM | POA: Insufficient documentation

## 2014-12-19 DIAGNOSIS — E039 Hypothyroidism, unspecified: Secondary | ICD-10-CM

## 2014-12-19 DIAGNOSIS — I2510 Atherosclerotic heart disease of native coronary artery without angina pectoris: Secondary | ICD-10-CM

## 2014-12-19 DIAGNOSIS — I639 Cerebral infarction, unspecified: Secondary | ICD-10-CM

## 2014-12-19 DIAGNOSIS — I6789 Other cerebrovascular disease: Secondary | ICD-10-CM

## 2014-12-19 DIAGNOSIS — I10 Essential (primary) hypertension: Secondary | ICD-10-CM

## 2014-12-19 DIAGNOSIS — J449 Chronic obstructive pulmonary disease, unspecified: Secondary | ICD-10-CM | POA: Diagnosis present

## 2014-12-19 DIAGNOSIS — I480 Paroxysmal atrial fibrillation: Secondary | ICD-10-CM

## 2014-12-19 DIAGNOSIS — E785 Hyperlipidemia, unspecified: Secondary | ICD-10-CM

## 2014-12-19 DIAGNOSIS — R001 Bradycardia, unspecified: Secondary | ICD-10-CM

## 2014-12-19 LAB — GLUCOSE, CAPILLARY
GLUCOSE-CAPILLARY: 96 mg/dL (ref 65–99)
GLUCOSE-CAPILLARY: 219 mg/dL — AB (ref 65–99)
Glucose-Capillary: 95 mg/dL (ref 65–99)
Glucose-Capillary: 101 mg/dL — ABNORMAL HIGH (ref 65–99)

## 2014-12-19 LAB — LIPID PANEL
CHOL/HDL RATIO: 2.9 ratio
CHOLESTEROL: 114 mg/dL (ref 0–200)
HDL: 40 mg/dL — AB (ref >40)
LDL Cholesterol: 61 mg/dL (ref 0–99)
TRIGLYCERIDES: 66 mg/dL (ref <150)
VLDL: 13 mg/dL (ref 0–40)

## 2014-12-19 LAB — TSH: TSH: 6.019 u[IU]/mL — AB (ref 0.350–4.500)

## 2014-12-19 LAB — T4, FREE: FREE T4: 0.75 ng/dL (ref 0.61–1.12)

## 2014-12-19 MED ORDER — ROSUVASTATIN CALCIUM 20 MG PO TABS: 20.0000 mg | ORAL_TABLET | Freq: Every day | ORAL | Status: DC

## 2014-12-19 MED ORDER — LEVOTHYROXINE SODIUM 25 MCG PO TABS
25.0000 ug | ORAL_TABLET | Freq: Every day | ORAL | Status: DC
  Administered 2014-12-19: 25 ug via ORAL
  Filled 2014-12-19: qty 1

## 2014-12-19 MED ORDER — ROSUVASTATIN CALCIUM 20 MG PO TABS
20.0000 mg | ORAL_TABLET | Freq: Every day | ORAL | Status: DC
  Administered 2014-12-19: 20 mg via ORAL
  Filled 2014-12-19 (×3): qty 1

## 2014-12-19 MED ORDER — AMLODIPINE BESYLATE 5 MG PO TABS
5.0000 mg | ORAL_TABLET | Freq: Every day | ORAL | Status: DC
  Filled 2014-12-19: qty 1

## 2014-12-19 MED ORDER — TAMSULOSIN HCL 0.4 MG PO CAPS
0.4000 mg | ORAL_CAPSULE | Freq: Every day | ORAL | Status: DC
  Administered 2014-12-19 – 2014-12-20 (×2): 0.4 mg via ORAL
  Filled 2014-12-19 (×2): qty 1

## 2014-12-19 NOTE — Progress Notes (Signed)
STROKE TEAM PROGRESS NOTE   HISTORY Derek Flores is an active 81 y.o. 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 The son reported that the patient was able to walk but he could not speak. He also appeared to be having difficulty swallowing. No facial droop or unilateral weakness was noted. Onset of symptoms was estimated to be 1315 (LKW 1315 at 12/18/2014). EMS was summoned. The patient was able to get on the stretcher under his own power. He was brought to [facility] ED as a code stroke. A CT scan on arrival was negative for acute changes. Dr. Doy Mince examined the patient and noted an NIH score of 6. She spoke with the family regarding the risks as well as the benefits of giving TPA and a decision is made to proceed with treatment. TPA started at 1437. He was admitted to the neuro ICU for further evaluation and treatment.   SUBJECTIVE (INTERVAL HISTORY) His nurses are at the bedside, no family. Patient is up, sitting in the bed. Talkative. Laughing.  Overall he feels his condition is stable. 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."   OBJECTIVE Temp:  [32.7 F (36.5 C)-98.2 F (36.8 C)] 98.1 F (36.7 C) (06/13 0400) Pulse Rate:  [34-93] 44 (06/13 0800) Cardiac Rhythm:  [-]  Resp:  [16-38] 24 (06/13 0800) BP: (116-170)/(38-95) 125/57 mmHg (06/13 0800) SpO2:  [92 %-100 %] 96 % (06/13 0800) Weight:  [67.132 kg (148 lb)] 67.132 kg (148 lb) (06/12 1400)   Recent Labs Lab 12/18/14 1838 12/18/14 2146 12/19/14 0820  GLUCAP 109* 110* 96    Recent Labs Lab 12/18/14 1419  NA 141  K 4.3  CL 112*  CO2 22  GLUCOSE 135*  BUN 35*  CREATININE 1.82*  CALCIUM 9.2    Recent Labs Lab 12/18/14 1419  AST 19  ALT 13*  ALKPHOS 59  BILITOT 0.5  PROT 6.6  ALBUMIN 3.6    Recent Labs Lab 12/18/14 1419  WBC 6.3  NEUTROABS 3.7  HGB 10.7*  HCT 32.0*  MCV 93.8  PLT 179   No results for input(s): CKTOTAL, CKMB, CKMBINDEX, TROPONINI in the  last 168 hours.  Recent Labs  12/18/14 1419  LABPROT 13.9  INR 1.05   No results for input(s): COLORURINE, LABSPEC, PHURINE, GLUCOSEU, HGBUR, BILIRUBINUR, KETONESUR, PROTEINUR, UROBILINOGEN, NITRITE, LEUKOCYTESUR in the last 72 hours.  Invalid input(s): APPERANCEUR     Component Value Date/Time   CHOL 114 12/19/2014 0234   TRIG 66 12/19/2014 0234   HDL 40* 12/19/2014 0234   CHOLHDL 2.9 12/19/2014 0234   VLDL 13 12/19/2014 0234   LDLCALC 61 12/19/2014 0234   No results found for: HGBA1C No results found for: LABOPIA, COCAINSCRNUR, LABBENZ, AMPHETMU, THCU, LABBARB  No results for input(s): ETH in the last 168 hours.  Ct Head Wo Contrast  12/18/2014   CLINICAL DATA:  Difficulty swallowing.  Unable to follow commands.  EXAM: CT HEAD WITHOUT CONTRAST  TECHNIQUE: Contiguous axial images were obtained from the base of the skull through the vertex without intravenous contrast.  COMPARISON:  12/15/2008  FINDINGS: Sinuses/Soft tissues: Clear paranasal sinuses and mastoid air cells.  Intracranial: Mild for age low density in the periventricular white matter likely related to small vessel disease. Expected cerebral volume loss for age. Cerebellar atrophy is also likely age-appropriate. Vertebral and carotid atherosclerosis bilaterally. No mass lesion, hemorrhage, hydrocephalus, acute infarct, intra-axial, or extra-axial fluid collection.  IMPRESSION: 1.  No  acute intracranial abnormality. 2.  Cerebral atrophy and small vessel ischemic change. These results were called by telephone at the time of interpretation on 12/18/2014 at 2:24 pm to Dr. Doy Mince, who verbally acknowledged these results.   Electronically Signed   By: Abigail Miyamoto M.D.   On: 12/18/2014 14:39   Dg Chest Port 1 View  12/19/2014   CLINICAL DATA:  Stroke.  EXAM: PORTABLE CHEST - 1 VIEW  COMPARISON:  12/07/2013 and CT chest 01/29/2011.  FINDINGS: Trachea is midline. Heart size stable. Biapical pleural parenchymal scarring. Mild  bibasilar airspace opacification, left greater than right. No definite pleural fluid. Surgical clips are seen in both axillary regions.  IMPRESSION: Mild bibasilar airspace opacification may be due to atelectasis. Difficult to exclude pneumonia or aspiration.   Electronically Signed   By: Lorin Picket M.D.   On: 12/19/2014 07:48     PHYSICAL EXAM Frail elderly african [place] male not in distress. . Afebrile. Head is nontraumatic. Severely decreased hearing bilaterally. Neck is supple without bruit.    Cardiac exam no murmur or gallop. Lungs are clear to auscultation. Distal pulses are well felt. Neurological Exam :  Awake alert oriented 2. Speech is slightly nonfluent with word finding difficulties but able to speak sentences quite clearly. Able to name and repeat and comprehend quite well. No dysarthria. Extraocular movements are full range without nystagmus. Fundi were not visualized. Vision acuity seems adequate. Face is slightly asymmetric when he smiles with right laser labial fold asymmetry. Tongue is midline. Motor system exam reveals no upper or lower extremity drift. Diminished fine finger movements on the right. Mild weakness of right grip. Orbits left over right upper extremity. Mild right lower leg weakness of hip flexors and ankle dorsiflexor 4/5 but no lower extremity drift. Deep tendon reflexes are symmetric. Touch pinprick sensation appear preserved bilaterally. Plantars are downgoing. Gait was not tested. NIHSS 2 ASSESSMENT/PLAN [name] is a 81 y.o. male with history of hypertension, CAD, dyslipidemia, and breast cancer presenting with speech difficulties and dysphagia. He was administered IV t-PA 12/18/2014 at 1437.   Stroke:  left brain infarct s/p IV tPA, workup underway,  Source unknown  Resultant  Expressive aphasia  MRI  pending   MRA  pending   Carotid Doppler  pending   2D Echo  pending   LDL 61  HgbA1c pending  SCDs for VTE prophylaxis. They worsen  pt's bilateral foot spasms, no he refuses to wear. Not a pharmacologic intervention candidate as within 24h of tPA.   Diet NPO time specified  aspirin 81 mg orally every day prior to admission, now on no antithrombotic as within 24h post tPA. Resume aspirin if imaging at 24h neg for hemorrhage  Ongoing aggressive stroke risk factor management  Transfer to the floor  Therapy recommendations:  pending . May be OOB  Disposition:  Anticipate return home  Hypertension  Home meds:   Norvasc, HCTZ  Stable  Permissive hypertension (OK if < 220/120) but gradually normalize in 5-7 days   Hyperlipidemia  Home meds:  crestor 20, resumed in hospital  LDL 61, goal < 70  Continue statin at discharge  Other Stroke Risk Factors  Advanced age  Former Cigarette smoker, quit smoking 34 years ago  Family hx stroke (mother)  Coronary artery disease - stent 2008  Other Active Problems  CKD stage 3, GFR 31, Cr 1.82  BPH on flomax  Asymptomatic bradycardia, 40s  Bilateral feet spasms  Other Pertinent History  Hx  breast cancer R 1996, [facility] day # [place] for Pager information 12/19/2014 9:05 AM  I have personally examined this patient, reviewed notes, independently viewed imaging studies, participated in medical decision making and plan of care. I have made any additions or clarifications directly to the above note. Agree with note above. He presented with aphasia and speech difficulties and received IV tPA and this seems to be improving but remains at risk for neurological worsening, recurrent stroke, TIA and needs ongoing stroke evaluation and aggressive risk factor modification. Continue ICU level care and strict control of blood pressure and follow-up imaging as per post TPA protocol.  This patient is critically ill and at significant risk of neurological worsening, death and care requires constant monitoring of vital signs,  hemodynamics,respiratory and cardiac monitoring, extensive review of multiple databases, frequent neurological assessment, discussion with family, other specialists and medical decision making of high complexity.I have made any additions or clarifications directly to the above note.  I spent 30 minutes of neurocritical care time  in the care of  this patient.     Antony Contras, MD Medical Director [facility] Pager: [phone] 12/19/2014 2:17 PM    To contact Stroke Continuity provider, please refer to [email or web address] After hours, contact General Neurology

## 2014-12-19 NOTE — Evaluation (Signed)
Speech Language Pathology Evaluation Patient Details Name: Derek Flores MRN: [id number] DOB: [birth year] Today's Date: 12/19/2014 Time: 4920-1007 SLP Time Calculation (min) (ACUTE ONLY): 18 min  Problem List:  Patient Active Problem List   Diagnosis Date Noted  . Stroke 12/18/2014  . Hx of radiation therapy   . 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 Coronary artery disease   . Dyslipidemia   . Hypertension   . Chronic kidney disease   . Bradycardia   . Lipoma   . Arthritis   . Breast cancer   . Prostate cancer    Past Medical History:  Past Medical History  Diagnosis Date  . Cancer   . Coronary artery disease   . Dyslipidemia   . Chronic kidney disease     chronic  . Bradycardia     chronic  . Hypertension   . Anemia 12/21/2008  . Lipoma     L arm  . Arthritis     degenerative, arthralgias  . Breast cancer 01/02/1995, 11/07/1995     r breast '96, l breast '97  . Prostate cancer     pt denies prostate cancer  . Hx of radiation therapy 07/25/11 to 08/26/11    palliative, r axilla   Past Surgical History:  Past Surgical History  Procedure Laterality Date  . Mastectomy      hx bilat mastectomies, R 1996, L 1997  . Coronary stent placement  2008  . 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 The son reported that the patient was able to walk but he could not speak. He also appeared to be having difficulty swallowing. No facial droop or unilateral weakness was noted.He was brought to [facility] ED as a code stroke. A CT scan on arrival was negative for acute changes. TPA started at 1437.  MRI pending.     Assessment / Plan / Recommendation Clinical Impression  Pt presents with mild aphasia, resolving, characterized primarily by occasional phonemic paraphasias during conversation.  Auditory  comprehension is intact; no dysarthria.  Responsive and generative naming, repetition intact.  Fully fluent.  Pt is hearing-impaired, wears bilateral aids, which is primary barrier to communication.  No SLP f/u is recommended at this time.     SLP Assessment  Patient does not need any further Speech Lanaguage Pathology Services    Follow Up Recommendations  None       Pertinent Vitals/Pain Pain Assessment: No/denies pain   SLP Goals  Patient/Family Stated Goal: `  SLP Evaluation Prior Functioning  Cognitive/Linguistic Baseline: Within functional limits Type of Home: House  Lives With: Alone (eats out for most meals; drives)   Cognition  Overall Cognitive Status: Within Functional Limits for tasks assessed Arousal/Alertness: Awake/alert Orientation Level: Oriented X4 Attention: Sustained Sustained Attention: Appears intact    Comprehension  Auditory Comprehension Overall Auditory Comprehension: Appears within functional limits for tasks assessed Yes/No Questions: Within Functional Limits Commands: Within Functional Limits (followed three step command) Conversation: Simple Visual Recognition/Discrimination Discrimination: Within Function Limits Reading Comprehension Reading Status: Within funtional limits    Expression Expression Primary Mode of Expression: Verbal Verbal Expression Overall Verbal Expression: Impaired Level of Generative/Spontaneous Verbalization: Conversation Repetition: No impairment Naming: Impairment Responsive: 76-100% accurate Confrontation: Within functional limits Other Naming Comments: dysnomia during conversation - mild  Verbal Errors: Phonemic paraphasias Pragmatics: No impairment Written Expression Dominant Hand: Right Written Expression: Not tested   Oral / Motor Oral Motor/Sensory Function Overall Oral Motor/Sensory Function: Appears within functional limits for tasks assessed Motor Speech Overall Motor Speech: Appears within  functional limits for tasks assessed   Dallis Czaja L. Tivis Ringer, [place] CCC/SLP Pager [phone]      Juan Quam Laurice 12/19/2014, 9:14 AM

## 2014-12-19 NOTE — Consult Note (Signed)
Patient was seen, examined, treatment plan was discussed with the Physician extender. I have directly reviewed the clinical findings, lab, imaging studies and management of this patient in detail. I have made the necessary changes to the above noted documentation, and agree with the documentation, as recorded by the Physician extender.  Brief narrative: 81 year old male patient with past medical history COPD, previous metastatic breast cancer status post radiation, dyslipidemia, chronic kidney disease stage 3, hypertension. Pt presented to ED with speech difficulty on 12/18/2014. His NIHSS was 6. CT head did not show acute intracranial findings. He was evaluated by stroke team on admission and the decision was made to give TPA. He was admitted to neuro ICU for further evaluation and treatment.  TRH consulted for management of medical comorbidities.   Assessment & Plan  Principal Problem: Stroke / Left brain infarct status post tPA - Patient presented with expressive aphasia. He is status post TPA. - Management per primary team.  Active Problems: Essential hypertension - Per neurology okay to allow permissive HTN as long as BP less than 220/120 - Current BP is 152/65  CAD s/p DES to RCA (2008) - Not on beta blocker secondary to asymptomatic bradycardia. His HR is 48 at this time.  - Resume daily aspirin when okay with neurology post TPA - Continue statin therapy   History of breast cancer in male w/ mets post radiation - Recent outpatient oncology evaluation with Dr. Benay Spice on 5/23 - No clinical evidence for progression of breast cancer  COPD (chronic obstructive pulmonary disease) - Stable  - Not in acute exacerbation    Leisa Lenz [facility] [phone]  *For further details please refer to admission note done by physician extender below.   [facility] Consultation Note                                                                                    Castor Gittleman, is a 81  y.o. male  MRN: [id number]   DOB - [birth year]  Admit Date - 12/18/2014  Outpatient Primary MD for the patient is Olin Hauser, MD  Referring MD: Leonie Man / Neurology  Reason for consultation: Assist with medical management of neurological patient multiple medical problems  With History of -  Past Medical History  Diagnosis Date  . Cancer   . Coronary artery disease   . Dyslipidemia   . Chronic kidney disease     chronic  . Bradycardia     chronic  . Hypertension   . Anemia 12/21/2008  . Lipoma     L arm  . Arthritis     degenerative, arthralgias  . Breast cancer 01/02/1995, 11/07/1995     r breast '96, l breast '97  . Prostate cancer     pt denies prostate cancer  . Hx of radiation therapy 07/25/11 to 08/26/11    palliative, r axilla      Past Surgical History  Procedure Laterality Date  . Mastectomy      hx bilat mastectomies, R 1996, L 1997  . Coronary stent placement  2008  . Cataract extraction  1998    bilat    in for   Chief Complaint  Patient presents with  . Code Stroke     HPI This is a 81 year old male patient with past medical history COPD, chronic bradycardia, previous metastatic breast cancer post radiation, dyslipidemia, stage III chronic kidney disease, hypertension who presented to the ER with dysarthria. He was within the window to receive TPA and was subsequently given this treatment and admitted to the ICU by the neurological team. Patient did not have any motor or sensation deficits and post tPA only has minimal residual dysarthria. Patient has significant hearing loss as well requiring bilateral hearing aids and this may influence in some of the patient's speech and receptive issues. The neurological team has asked for [place] to see the patient in consultation to assist with managing his medical problems during his hospital stay.   Review of Systems   In addition to the HPI above,  No Fever-chills, myalgias or other constitutional symptoms No Headache,  changes with Vision or hearing, new focal weakness, tingling, numbness in any extremity, No problems swallowing food or Liquids, indigestion/reflux No Chest pain, Cough or Shortness of Breath, palpitations, orthopnea or DOE No Abdominal pain, N/V; no melena or hematochezia, no dark tarry stools, Bowel movements are regular, No dysuria, hematuria or flank pain No new skin rashes, lesions, masses or bruises, No new joints pains-aches No recent weight gain or loss No polyuria, polydypsia or polyphagia,  *A full 10 point Review of Systems was done, except as stated above, all other Review of Systems were negative.  Social History History  Substance Use Topics  . Smoking status: Former Smoker -- 1.00 packs/day for 50 years    Types: Cigarettes    Quit date: 07/09/1980  . Smokeless tobacco: Not on file  . 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 Cancer Father     prostate  . Cancer Sister     breast     Prior to Admission medications   Medication Sig Start Date End Date Taking? Authorizing Provider  amLODipine (NORVASC) 5 MG tablet Take 5 mg by mouth at bedtime.  06/17/11  Yes Historical Provider, MD  aspirin 81 MG tablet Take 81 mg by mouth daily.     Yes Historical Provider, MD  CRESTOR 20 MG tablet Take 20 mg by mouth at bedtime.  05/01/11  Yes Historical Provider, MD  cyanocobalamin 1000 MCG tablet Take 1,000 mcg by mouth daily.    Yes Historical Provider, MD  diphenhydramine-acetaminophen (TYLENOL PM) 25-500 MG TABS Take 1 tablet by mouth at bedtime as needed.   Yes Historical Provider, MD  levothyroxine (SYNTHROID, LEVOTHROID) 25 MCG tablet Take 25 mcg by mouth at bedtime.    Yes Historical Provider, MD  acetaminophen (TYLENOL) 500 MG tablet Take 1,000 mg by mouth daily.     Historical Provider, MD  hydrochlorothiazide (HYDRODIURIL) 25 MG tablet Take 25 mg by mouth. 12/17/13 12/17/14  Historical Provider, MD  NITROSTAT 0.4 MG SL tablet Place 0.4 mg under the tongue every 5 (five) minutes as needed for chest pain.  05/23/14   Historical Provider, MD  tamsulosin (FLOMAX) 0.4 MG CAPS Take 0.4 mg by mouth daily. 10/28/12   Historical Provider, MD    No Known Allergies  Physical Exam  Vitals  Blood pressure 152/65, pulse 48, temperature 97.4 F (36.3 C), temperature source  Oral, resp. rate 25, height 5\' 10"  (1.778 m), weight 142 lb 10.2 oz (64.7 kg), SpO2 96 %.   General:  In no acute distress, appears healthy and well nourished  Psych:  Normal affect, Denies Suicidal or Homicidal ideations, Awake Alert, Oriented X 3. Speech and thought patterns are clear and appropriate, no apparent short term memory deficits  Neuro:   No focal neurological deficits, CN II through XII intact, Strength 5/5 all 4 extremities, Sensation intact all 4 extremities. Very hard of hearing seems to have some residual dysarthria  ENT:  Ears and Eyes appear Normal, Conjunctivae clear, PER. Moist oral mucosa without erythema or exudates.  Neck:  Supple, No lymphadenopathy appreciated  Respiratory:  Symmetrical chest wall movement, Good air movement bilaterally, CTAB. Room Air  Cardiac:  RRR bradycardic rate, No Murmurs, no LE edema noted, no JVD, No carotid bruits, peripheral pulses palpable at 2+  Abdomen:  Positive bowel sounds, Soft, Non tender, Non distended,  No masses appreciated, no obvious hepatosplenomegaly  Skin:  No Cyanosis, Normal Skin Turgor, No Skin Rash or Bruise.  Extremities: Symmetrical without obvious trauma or injury,  no effusions. Mild edema right upper extremity which is chronic  Data Review  CBC  Recent Labs Lab 12/18/14 1419  WBC 6.3  HGB 10.7*  HCT 32.0*  PLT 179  MCV 93.8  MCH 31.4  MCHC 33.4  RDW 13.5  LYMPHSABS 1.9  MONOABS 0.4  EOSABS 0.2    BASOSABS 0.0    Chemistries   Recent Labs Lab 12/18/14 1419  NA 141  K 4.3  CL 112*  CO2 22  GLUCOSE 135*  BUN 35*  CREATININE 1.82*  CALCIUM 9.2  AST 19  ALT 13*  ALKPHOS 59  BILITOT 0.5    estimated creatinine clearance is 23.7 mL/min (by C-G formula based on Cr of 1.82).  No results for input(s): TSH, T4TOTAL, T3FREE, THYROIDAB in the last 72 hours.  Invalid input(s): FREET3  Coagulation profile  Recent Labs Lab 12/18/14 1419  INR 1.05    No results for input(s): DDIMER in the last 72 hours.  Cardiac Enzymes No results for input(s): CKMB, TROPONINI, MYOGLOBIN in the last 168 hours.  Invalid input(s): CK  Invalid input(s): POCBNP  Urinalysis No results found for: COLORURINE, APPEARANCEUR, LABSPEC, PHURINE, GLUCOSEU, HGBUR, BILIRUBINUR, KETONESUR, PROTEINUR, UROBILINOGEN, NITRITE, LEUKOCYTESUR  Imaging results:   Ct Head Wo Contrast  12/18/2014   CLINICAL DATA:  Difficulty swallowing.  Unable to follow commands.  EXAM: CT HEAD WITHOUT CONTRAST  TECHNIQUE: Contiguous axial images were obtained from the base of the skull through the vertex without intravenous contrast.  COMPARISON:  12/15/2008  FINDINGS: Sinuses/Soft tissues: Clear paranasal sinuses and mastoid air cells.  Intracranial: Mild for age low density in the periventricular white matter likely related to small vessel disease. Expected cerebral volume loss for age. Cerebellar atrophy is also likely age-appropriate. Vertebral and carotid atherosclerosis bilaterally. No mass lesion, hemorrhage, hydrocephalus, acute infarct, intra-axial, or extra-axial fluid collection.  IMPRESSION: 1.  No acute intracranial abnormality. 2.  Cerebral atrophy and small vessel ischemic change. These results were called by telephone at the time of interpretation on 12/18/2014 at 2:24 pm to Dr. Doy Mince, who verbally acknowledged these results.   Electronically Signed   By: Abigail Miyamoto M.D.   On: 12/18/2014 14:39   Dg Chest  Port 1 View  12/19/2014   CLINICAL DATA:  Stroke.  EXAM: PORTABLE CHEST - 1 VIEW  COMPARISON:  12/07/2013 and CT chest 01/29/2011.  FINDINGS: Trachea is midline. Heart size stable. Biapical pleural parenchymal scarring. Mild bibasilar airspace opacification, left greater than right. No definite pleural fluid. Surgical clips are seen in both axillary regions.  IMPRESSION: Mild bibasilar airspace opacification may be due to atelectasis. Difficult to exclude pneumonia or aspiration.   Electronically Signed   By: Lorin Picket M.D.   On: 12/19/2014 07:48     EKG: (Independently reviewed) sinus bradycardia with frequent PACs and a few unifocal PVCs, QTC 365 ms   Assessment & Plan  Principal Problem:   Stroke -Patient is to transfer out of neuro ICU to [place] later today -Management per neurology team  Active Problems:   Hypertension -Current blood pressure moderately controlled off Norvasc -Neurology documents okay for permissive hypertension as long as blood pressure less than 220/120 -Follow up on results of echocardiogram    CAD s/p DES to RCA (2008) -Currently asymptomatic -No beta blocker secondary to asymptomatic bradycardia -Resume daily aspirin when okay with neurology post TPA -Resume statin    CKD (chronic kidney disease), stage III -Renal function stable and near baseline of BUN 41 and creatinine 1.53 (March 2016) -Recommend DC IV fluids since patient now on diet    Bradycardia (chronic) -Follow closely the patient's outpatient cardiology Dr. Beverlyn Roux -Asymptomatic    Dyslipidemia -Statin resumed 6/13    History of breast cancer in male w/ mets post radiation -Recent outpatient oncology evaluation with Dr. Benay Spice on 5/23 -No clinical evidence for progression of breast cancer -Improved right arm edema with use of lymphatic compression sleeve    COPD (chronic obstructive pulmonary disease) -Stable without wheezing -Documented history of chronic stable dyspnea on  exertion that appears to have worsened since recent pneumonia treatment every 2016    DVT Prophylaxis: SCDs  Family Communication:  No family at bedside   Code Status:  Full code  Condition:  Stable  Discharge disposition:  Time spent in minutes : 60      ELLIS,ALLISON L. ANP on 12/19/2014 at 1:17 PM  Between 7am to 7pm - Pager - [phone]  After 7pm go to [email or web address] - password [email or web address]  And look for the night coverage person covering me after hours  [facility]

## 2014-12-19 NOTE — Progress Notes (Signed)
VASCULAR LAB PRELIMINARY  PRELIMINARY  PRELIMINARY  PRELIMINARY  Carotid duplex  completed.    Preliminary report:  Bilateral:  1-39% ICA stenosis.  Vertebral artery flow is antegrade.      Derek Flores, RVT 12/19/2014, 9:49 AM

## 2014-12-19 NOTE — Progress Notes (Signed)
Echocardiogram 2D Echocardiogram has been performed.  Derek Flores 12/19/2014, 1:33 PM

## 2014-12-19 NOTE — Evaluation (Signed)
Clinical/Bedside Swallow Evaluation Patient Details  Name: Derek Flores MRN: [id number] Date of Birth: 1921/02/14  Today's Date: 12/19/2014 Time: SLP Start Time (ACUTE ONLY): 0836 SLP Stop Time (ACUTE ONLY): 0846 SLP Time Calculation (min) (ACUTE ONLY): 10 min  Past Medical History:  Past Medical History  Diagnosis Date  . Cancer   . Coronary artery disease   . Dyslipidemia   . Chronic kidney disease     chronic  . Bradycardia     chronic  . Hypertension   . Anemia 12/21/2008  . Lipoma     L arm  . Arthritis     degenerative, arthralgias  . Breast cancer 01/02/1995, 11/07/1995     r breast '96, l breast '97  . Prostate cancer     pt denies prostate cancer  . Hx of radiation therapy 07/25/11 to 08/26/11    palliative, r axilla   Past Surgical History:  Past Surgical History  Procedure Laterality Date  . Mastectomy      hx bilat mastectomies, R 1996, L 1997  . Coronary stent placement  2008  . 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 The son reported that the patient was able to walk but he could not speak. He also appeared to be having difficulty swallowing. No facial droop or unilateral weakness was noted.He was brought to [facility] ED as a code stroke. A CT scan on arrival was negative for acute changes. TPA started at 1437.  MRI pending.     Assessment / Plan / Recommendation Clinical Impression  Pt presents with functional oropharyngeal swallow with adequate mastication, brisk swallow response, and no overt s/s of aspiration.  Recommend regular diet, thin liquids; meds whole in liquid.      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 The son reported that the patient was able to walk but he could not speak. He also appeared to be having difficulty swallowing. No facial droop or unilateral weakness was noted.He was brought to [facility] ED as a code stroke. A CT scan on arrival was negative for acute changes. TPA started at 1437.  MRI pending.   Type of Study: Bedside swallow evaluation Previous Swallow Assessment: no Diet Prior to this Study: NPO Temperature Spikes Noted: No Respiratory Status: Supplemental O2 delivered via (comment) History of Recent Intubation: No Behavior/Cognition: Alert;Cooperative Oral Cavity - Dentition:  (dentures) Self-Feeding Abilities: Able to feed self Patient Positioning: Upright in bed Baseline Vocal Quality: Normal Volitional Cough: Strong Volitional Swallow: Able to elicit    Oral/Motor/Sensory Function Overall Oral Motor/Sensory Function: Appears within functional limits for tasks assessed   Ice Chips Ice chips: Within functional limits   Thin Liquid Thin Liquid: Within functional limits Presentation: Cup;Straw    Nectar Thick Nectar Thick Liquid: Not tested   Honey Thick Honey Thick Liquid: Not tested   Puree Puree: Within functional limits   Solid  Derek Flores L.  [place], [place] CCC/SLP Pager [phone]     Solid: Within functional limits       Derek Flores 12/19/2014,9:06 AM

## 2014-12-19 NOTE — Progress Notes (Addendum)
Patient appearing more confused. Alert and oriented x3 however forgets directions, patient using heart monitor as telephone, patient forgets what RN instructs patient to do. MD paged.  Per previous RN, patient has been oriented but confused at times

## 2014-12-19 NOTE — Progress Notes (Signed)
Patient converted to a-fib, heart rate in 120's. Patient in no distress. Heart rate back down to 57. MD aware. Vitals to be taken q 2 per Burnetta Sabin

## 2014-12-19 NOTE — Progress Notes (Signed)
Post TPA MRI with small hemorrhage 2x48mm per Dr. Jola Baptist. Notified Dr. Leonie Man and RN. RN reports pt without neuro changes; currently up in hall with PT.    Will not resume antithrombotic given hemorrhage.  Goal SBP < 160.   Continue q2h VS/neuro checks in post tPA bed.  Repeat CT head in am  [place] for Pager information 12/19/2014 3:15 PM  I have personally examined this patient, reviewed notes, independently viewed imaging studies, participated in medical decision making and plan of care. I have made any additions or clarifications directly to the above note. Agree with note above.    Antony Contras, MD Medical Director [facility] Pager: [phone] 12/19/2014 3:58 PM

## 2014-12-19 NOTE — Consult Note (Signed)
Cardiology Consultation Note  Patient ID: Derek Flores, MRN: [id number], DOB/AGE: 1920/09/07 81 y.o. Admit date: 12/18/2014   Date of Consult: 12/19/2014 Primary Physician: Olin Hauser, MD Primary Cardiologist: unknown - ? Dr. Mitzi Davenport in [place] per patient but we cannot find anyone by that name.  Chief Complaint: slurred speech Reason for Consultation: newly recognized PAF, bradycardia  HPI: Derek Flores is a 81 y/o M with reported history of CAD s/p PCI to RCA in 2008, CKD stage III, chronic bradycardia, HTN, anemia, metastatic breast cancer (prior mastectomy, arimidex, radiation, stable clinically by OV 11/2014), chronic right lower arm edema likely due to lymphedema from prior mastectomy, hyperlipidemia who presented to [facility] with new onset stroke. Majority of the history was obtained from the chart. There are no prior cath reports in Epic. The patient initially denied any heart history and had a tendency to reverberate on the fact that the MRI he had earlier lasted 20 minutes and made his whole body vibrate. He later was able to tell us he had a stent 3-4 years ago (which is discordant from his H/P). He says he is here because they are checking out his heart. He told me that no one told him he had a stroke. Later in the conversation he seemed more lucid. He says he lives alone. 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 No facial droop or unilateral weakness was noted.Onset of symptoms was estimated to be 1315. EMS was called. A CT scan on arrival was negative for acute changes. TPA was administered. Post TPA MRI showed small hemorrhage 2x60mm. Further antithrombotics are being held. Carotid duplex 1-39% BICA. 2D echo pending. During his admission so far he has been noted to have sinus bradycardia down to the 30's frequently as well as atrial fibrillation with HR up to the 120s. Baseline HR appears in the 40s. His chart carries  diagnosis of chronic bradycardia - review of flowsheet indicates prior HR of 42 back in 2013, otherwise 40s-50s. He denies any awareness of this. He denies any CP, SOB, dizziness, presyncope, syncope, or falls. BP is being managed with permissive hypertension. He has not been started on any new agents for his PAF.  Past Medical History  Diagnosis Date  . Cancer   . Coronary artery disease   . Dyslipidemia   . Chronic kidney disease     chronic  . Bradycardia     chronic  . Hypertension   . Anemia 12/21/2008  . Lipoma     L arm  . Arthritis     degenerative, arthralgias  . Breast cancer 01/02/1995, 11/07/1995     r breast '96, l breast '97  . Prostate cancer     pt denies prostate cancer  . Hx of radiation therapy 07/25/11 to 08/26/11    palliative, r axilla      Most Recent Cardiac Studies: See above. No cath available.   Surgical History:  Past Surgical History  Procedure Laterality Date  . Mastectomy      hx bilat mastectomies, R 1996, L 1997  . Coronary stent placement  2008  . Cataract extraction  1998    bilat     Home Meds: Prior to Admission medications   Medication Sig Start Date End Date Taking? Authorizing Provider  amLODipine (NORVASC) 5 MG tablet Take 5 mg by mouth at bedtime.  06/17/11  Yes Historical Provider, MD  aspirin 81 MG tablet Take 81 mg  by mouth daily.     Yes Historical Provider, MD  CRESTOR 20 MG tablet Take 20 mg by mouth at bedtime.  05/01/11  Yes Historical Provider, MD  cyanocobalamin 1000 MCG tablet Take 1,000 mcg by mouth daily.    Yes Historical Provider, MD  diphenhydramine-acetaminophen (TYLENOL PM) 25-500 MG TABS Take 1 tablet by mouth at bedtime as needed.   Yes Historical Provider, MD  levothyroxine (SYNTHROID, LEVOTHROID) 25 MCG tablet Take 25 mcg by mouth at bedtime.    Yes Historical Provider, MD  acetaminophen (TYLENOL) 500 MG tablet Take 1,000 mg by mouth daily.    Historical Provider, MD  hydrochlorothiazide (HYDRODIURIL) 25 MG  tablet Take 25 mg by mouth. 12/17/13 12/17/14  Historical Provider, MD  NITROSTAT 0.4 MG SL tablet Place 0.4 mg under the tongue every 5 (five) minutes as needed for chest pain.  05/23/14   Historical Provider, MD  tamsulosin (FLOMAX) 0.4 MG CAPS Take 0.4 mg by mouth daily. 10/28/12   Historical Provider, MD    Inpatient Medications:  . amLODipine  5 mg Oral QHS  . levothyroxine  25 mcg Oral QHS  . pantoprazole (PROTONIX) IV  40 mg Intravenous QHS  . rosuvastatin  20 mg Oral q1800  . tamsulosin  0.4 mg Oral Daily      Allergies: No Known Allergies  History   Social History  . Marital Status: Single    Spouse Name: N/A  . Number of Children: N/A  . Years of Education: N/A   Occupational History  . Not on file.   Social History Main Topics  . Smoking status: Former Smoker -- 1.00 packs/day for 50 years    Types: Cigarettes    Quit date: 07/09/1980  . Smokeless tobacco: Not on file  . Alcohol Use: No  . Drug Use: No  . Sexual Activity: Not on file   Other Topics Concern  . Not on file   Social History Narrative   Lives alone, independent in ADL's   Drives himself     Family History  Problem Relation Age of Onset  . Cancer Father     prostate  . Cancer Sister     breast     Review of Systems: do not believe we can reliably obtain from patient at this time given mental status. Labs:  Lab Results  Component Value Date   WBC 6.3 12/18/2014   HGB 10.7* 12/18/2014   HCT 32.0* 12/18/2014   MCV 93.8 12/18/2014   PLT 179 12/18/2014    Recent Labs Lab 12/18/14 1419  NA 141  K 4.3  CL 112*  CO2 22  BUN 35*  CREATININE 1.82*  CALCIUM 9.2  PROT 6.6  BILITOT 0.5  ALKPHOS 59  ALT 13*  AST 19  GLUCOSE 135*   Lab Results  Component Value Date   CHOL 114 12/19/2014   HDL 40* 12/19/2014   LDLCALC 61 12/19/2014   TRIG 66 12/19/2014   Radiology/Studies:  Ct Head Wo Contrast  12/18/2014   CLINICAL DATA:  Difficulty swallowing.  Unable to follow commands.   EXAM: CT HEAD WITHOUT CONTRAST  TECHNIQUE: Contiguous axial images were obtained from the base of the skull through the vertex without intravenous contrast.  COMPARISON:  12/15/2008  FINDINGS: Sinuses/Soft tissues: Clear paranasal sinuses and mastoid air cells.  Intracranial: Mild for age low density in the periventricular white matter likely related to small vessel disease. Expected cerebral volume loss for age. Cerebellar atrophy is also likely age-appropriate. Vertebral  and carotid atherosclerosis bilaterally. No mass lesion, hemorrhage, hydrocephalus, acute infarct, intra-axial, or extra-axial fluid collection.  IMPRESSION: 1.  No acute intracranial abnormality. 2.  Cerebral atrophy and small vessel ischemic change. These results were called by telephone at the time of interpretation on 12/18/2014 at 2:24 pm to Dr. Doy Mince, who verbally acknowledged these results.   Electronically Signed   By: Abigail Miyamoto M.D.   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 The son reported that the patient was able to walk but he could not speak. He also appeared to be having difficulty swallowing. No facial droop or unilateral weakness was noted. Onset of symptoms was estimated to be 6/12 1315 (LKW 1315 at 12/18/2014). EMS was summoned. The patient was able to get on the stretcher under his own power. He was brought to [facility] ED as a code stroke. A CT scan on arrival was negative for acute changes. Dr. Doy Mince examined the patient and noted an NIH score of 6. She spoke with the family regarding the risks as well as the benefits of giving TPA and a decision is made to proceed with treatment. TPA started at 1437. He was admitted to the neuro ICU for further evaluation and treatment.  EXAM: MRI HEAD WITHOUT CONTRAST  MRA HEAD WITHOUT CONTRAST  TECHNIQUE: Multiplanar, multiecho pulse sequences of the  brain and surrounding structures were obtained without intravenous contrast. Angiographic images of the head were obtained using MRA technique without contrast.  COMPARISON:  CT head 12/18/2014.  FINDINGS: MRI HEAD FINDINGS  There is a large area of acute hemorrhage affecting the LEFT posterior frontal cortex and subcortical white matter, within an area of diffusion restriction, consistent with a post tPA hemorrhage into an area of cerebral infarction. The overall size of the hemorrhage is 2 x 3 cm, predominantly frontal in location but some extension towards the posterior frontal precentral motor cortex. There are no other areas of acute infarction or acute hemorrhage. No extra-axial fluid is evident. There is slight surrounding edema.  Moderate ventriculomegaly and cortical atrophy, not unexpected for age. Mild subcortical and periventricular T2 and FLAIR hyperintensities, likely chronic microvascular ischemic change. Flow voids are maintained in the carotid, basilar, and both vertebral arteries. Calvarium intact. Extracranial soft tissues grossly unremarkable.  Compared with prior CT, there are no ischemic or hemorrhagic changes visible.  MRA HEAD FINDINGS  Internal carotid arteries are widely patent. Basilar artery widely patent with vertebrals codominant. No intracranial stenosis or aneurysm. No visible MCA branch occlusion or embolus.  IMPRESSION: 2 x 3 cm area of acute hemorrhage LEFT frontal cortex and subcortical white matter within an area of restricted diffusion consistent with post t-PA hemorrhagic transformation of acute infarction.  Generalized atrophy and small vessel disease, not unexpected for age.  Findings discussed with Burnetta Sabin NP at time of interpretation.   Electronically Signed   By: Staci Righter M.D.   On: 12/19/2014 15:17   Dg Chest Port 1 View  12/19/2014   CLINICAL DATA:  Stroke.  EXAM: PORTABLE CHEST - 1 VIEW  COMPARISON:  12/07/2013 and CT chest 01/29/2011.  FINDINGS: Trachea is  midline. Heart size stable. Biapical pleural parenchymal scarring. Mild bibasilar airspace opacification, left greater than right. No definite pleural fluid. Surgical clips are seen in both axillary regions.  IMPRESSION: Mild bibasilar airspace opacification may be due to  atelectasis. Difficult to exclude pneumonia or aspiration.   Electronically Signed   By: Lorin Picket M.D.   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 The son reported that the patient was able to walk but he could not speak. He also appeared to be having difficulty swallowing. No facial droop or unilateral weakness was noted. Onset of symptoms was estimated to be 6/12 1315 (LKW 1315 at 12/18/2014). EMS was summoned. The patient was able to get on the stretcher under his own power. He was brought to [facility] ED as a code stroke. A CT scan on arrival was negative for acute changes. Dr. Doy Mince examined the patient and noted an NIH score of 6. She spoke with the family regarding the risks as well as the benefits of giving TPA and a decision is made to proceed with treatment. TPA started at 1437. He was admitted to the neuro ICU for further evaluation and treatment.  EXAM: MRI HEAD WITHOUT CONTRAST  MRA HEAD WITHOUT CONTRAST  TECHNIQUE: Multiplanar, multiecho pulse sequences of the brain and surrounding structures were obtained without intravenous contrast. Angiographic images of the head were obtained using MRA technique without contrast.  COMPARISON:  CT head 12/18/2014.  FINDINGS: MRI HEAD FINDINGS  There is a large area of acute hemorrhage affecting the LEFT posterior frontal cortex and subcortical white matter, within an area of diffusion restriction, consistent with a post tPA hemorrhage into an area of cerebral infarction. The overall size of the hemorrhage is 2 x 3 cm, predominantly frontal in location but  some extension towards the posterior frontal precentral motor cortex. There are no other areas of acute infarction or acute hemorrhage. No extra-axial fluid is evident. There is slight surrounding edema.  Moderate ventriculomegaly and cortical atrophy, not unexpected for age. Mild subcortical and periventricular T2 and FLAIR hyperintensities, likely chronic microvascular ischemic change. Flow voids are maintained in the carotid, basilar, and both vertebral arteries. Calvarium intact. Extracranial soft tissues grossly unremarkable.  Compared with prior CT, there are no ischemic or hemorrhagic changes visible.  MRA HEAD FINDINGS  Internal carotid arteries are widely patent. Basilar artery widely patent with vertebrals codominant. No intracranial stenosis or aneurysm. No visible MCA branch occlusion or embolus.  IMPRESSION: 2 x 3 cm area of acute hemorrhage LEFT frontal cortex and subcortical white matter within an area of restricted diffusion consistent with post t-PA hemorrhagic transformation of acute infarction.  Generalized atrophy and small vessel disease, not unexpected for age.  Findings discussed with Burnetta Sabin NP at time of interpretation.   Electronically Signed   By: Staci Righter M.D.   On: 12/19/2014 15:17    Wt Readings from Last 3 Encounters:  12/19/14 142 lb 10.2 oz (64.7 kg)  11/28/14 148 lb 4.8 oz (67.268 kg)  08/01/14 138 lb 6.4 oz (62.778 kg)   EKG: sinus bradycardia with PACs and PVCs   Physical Exam: Blood pressure 133/82, pulse 57, temperature 97.4 F (36.3 C), temperature source Oral, resp. rate 16, height 5\' 10"  (1.778 m), weight 142 lb 10.2 oz (64.7 kg), SpO2 95 %. General: Well develop thin elderly WM in no acute distress. Head: Normocephalic, atraumatic, sclera non-icteric, no xanthomas, nares are without discharge.  Neck: Negative for carotid bruits. JVD not elevated. Lungs: Clear bilaterally to auscultation without wheezes, rales, or rhonchi. Breathing is  unlabored. Heart:  Distant heart sounds but reg rhythm, slow with S1 S2. No murmurs, rubs, or gallops appreciated. Abdomen: Soft, non-tender, non-distended with normoactive bowel sounds. No hepatomegaly. No rebound/guarding. No obvious abdominal masses. Msk:  Strength and tone appear normal for age. Extremities: No clubbing or cyanosis. No edema.  Distal pedal pulses are 2+ and equal bilaterally. Neuro: Easily alert and oriented to name. Struggled with the date - had to be redirected to the question that was being asked (kept repeating date for the month). Eventually he knew it was the 13th of 2016, but could not recall the month. Knows he is at [facility]. Intermittent word finding difficulty.     Assessment and Plan:   1. Acute stroke s/p TPA with hemorrhagic transformation 2. Paroxysmal atrial fibrillation with likely tachy-brady syndrome  3. Chronic sinus bradycardia  4. CAD s/p PCI in 2008, without current angina 5. Hypertension 6. CKD stage III 7. Hypothyroidism  Telemetry is consistent with a tachy-brady syndrome/sick sinus syndrome. However, he is not presently symptomatic with this. His bradycardia and tachycardia do not seem to cause hemodynamic instability. In light of age, lack of current symptoms and comorbidities, it does not appear pacing is indicated acutely at this time. He told us he's had chronic bradycardia for years and it has never bothered him. We are unable to pursue rate control for his higher rates because that would exacerbate the underlying bradycardia. If he becomes symptomatic, however, we may need to revisit our EP options as it sounds like he was independent PTA. Will follow on telemetry. He seems to have residual intermittent dysarthria and word-finding difficulty. No anticoagulation at present time due to hemorrhagic conversion - this can be revisited with neurology once he is deemed safe. CHADSVASC score is at least 6. Await echo and A1c to complete his assessment.  Update thyroid function. He would benefit from follow-up with EP as an outpatient.   SignedMelina Copa PA-C 12/19/2014, 5:08 PM Pager: [phone]  Agree with findings by Melina Copa PA-C  Pt admitted with CVA. Rx with rTPA with hemorraghic conversion.  H/O HTN and prior Stent. He has long H/O asymptomatic brady with HR in 40s. On tele he has SSS with pauses , Afib. I suspect this was the cause of his CVA. Timing of anticoagulation up to Neuro, typically waite several weeks. No indication for PTVPM. Avoid rate slowing meds.    Lorretta Harp, M.D., [place], [facility], [place] [address] [place], [place]  [address]  [phone] 12/19/2014 6:38 PM

## 2014-12-19 NOTE — Progress Notes (Signed)
PT Cancellation Note  Patient Details Name: Derek Flores MRN: [id number] DOB: 1921-01-05   Cancelled Treatment:    Reason Eval/Treat Not Completed: Patient not medically ready. Noted pt on strict bedrest. Will follow and proceed with PT eval when appropriate.   Kalmen Lollar 12/19/2014, 8:57 AM Pager [phone]
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
## 2014-12-20 ENCOUNTER — Inpatient Hospital Stay (HOSPITAL_COMMUNITY): Payer: Medicare Other

## 2014-12-20 DIAGNOSIS — I619 Nontraumatic intracerebral hemorrhage, unspecified: Secondary | ICD-10-CM

## 2014-12-20 LAB — CBC
HEMATOCRIT: 30.2 % — AB (ref 39.0–52.0)
Hemoglobin: 10.1 g/dL — ABNORMAL LOW (ref 13.0–17.0)
MCH: 31.1 pg (ref 26.0–34.0)
MCHC: 33.4 g/dL (ref 30.0–36.0)
MCV: 92.9 fL (ref 78.0–100.0)
PLATELETS: 164 10*3/uL (ref 150–400)
RBC: 3.25 MIL/uL — ABNORMAL LOW (ref 4.22–5.81)
RDW: 13.3 % (ref 11.5–15.5)
WBC: 7 10*3/uL (ref 4.0–10.5)

## 2014-12-20 LAB — BASIC METABOLIC PANEL
Anion gap: 10 (ref 5–15)
BUN: 23 mg/dL — AB (ref 6–20)
CALCIUM: 8.9 mg/dL (ref 8.9–10.3)
CO2: 23 mmol/L (ref 22–32)
Chloride: 107 mmol/L (ref 101–111)
Creatinine, Ser: 1.49 mg/dL — ABNORMAL HIGH (ref 0.61–1.24)
GFR calc Af Amer: 45 mL/min — ABNORMAL LOW (ref >60)
GFR calc non Af Amer: 39 mL/min — ABNORMAL LOW (ref >60)
GLUCOSE: 101 mg/dL — AB (ref 65–99)
Potassium: 4.1 mmol/L (ref 3.5–5.1)
Sodium: 140 mmol/L (ref 135–145)

## 2014-12-20 LAB — GLUCOSE, CAPILLARY
Glucose-Capillary: 93 mg/dL (ref 65–99)
Glucose-Capillary: 100 mg/dL — ABNORMAL HIGH (ref 65–99)

## 2014-12-20 LAB — HEMOGLOBIN A1C
HEMOGLOBIN A1C: 6.7 % — AB (ref 4.8–5.6)
Mean Plasma Glucose: 146 mg/dL

## 2014-12-20 MED ORDER — HYDROCHLOROTHIAZIDE 25 MG PO TABS: 25.0000 mg | ORAL_TABLET | Freq: Every day | ORAL | Status: DC

## 2014-12-20 MED ORDER — PANTOPRAZOLE SODIUM 40 MG PO TBEC: 40.0000 mg | DELAYED_RELEASE_TABLET | Freq: Every day | ORAL | Status: DC

## 2014-12-20 NOTE — Progress Notes (Signed)
Subjective:  No chest pain  Objective:   Vital Signs : Filed Vitals:   12/20/14 0406 12/20/14 0538 12/20/14 1038 12/20/14 1407  BP: 146/70 143/55 131/62 134/55  Pulse: 46 45 45 46  Temp: 98.1 F (36.7 C) 97.5 F (36.4 C) 97.6 F (36.4 C) 97.7 F (36.5 C)  TempSrc: Oral Oral Oral Oral  Resp: _0 Height:      Weight:      SpO2: 95% 94% 100% 98%    Intake/Output from previous day: No intake or output data in the 24 hours ending 12/20/14 1550  I/O since admission: +289  Wt Readings from Last 3 Encounters:  12/19/14 142 lb 10.2 oz (64.7 kg)  11/28/14 148 lb 4.8 oz (67.268 kg)  08/01/14 138 lb 6.4 oz (62.778 kg)    Medications: . amLODipine  5 mg Oral QHS  . levothyroxine  25 mcg Oral QHS  . pantoprazole  40 mg Oral QHS  . rosuvastatin  20 mg Oral q1800  . tamsulosin  0.4 mg Oral Daily       Physical Exam:   General appearance: alert, cooperative and hard of hearing Neck: no adenopathy, no JVD, supple, symmetrical, trachea midline and thyroid not enlarged, symmetric, no tenderness/mass/nodules Lungs: decreased BS Heart: regularly irregular rhythm Abdomen: soft, non-tender; bowel sounds normal; no masses,  no organomegaly Extremities: no edema, redness or tenderness in the calves or thighs Pulses: 2+ and symmetric Neurologic: per neuro   Rate: 50  Rhythm: sinus with ectopy  ECG (independently read by me): SB with PAC and PVC  Lab Results:   Recent Labs  12/18/14 1419 12/20/14 0550  NA 141 140  K 4.3 4.1  CL 112* 107  CO2 22 23  GLUCOSE 135* 101*  BUN 35* 23*  CREATININE 1.82* 1.49*  CALCIUM 9.2 8.9    Hepatic Function Latest Ref Rng 12/18/2014 03/02/2010 12/12/2008  Total Protein 6.5 - 8.1 g/dL 6.6 6.6 6.6  Albumin 3.5 - 5.0 g/dL 3.6 4.1 3.7  AST 15 - 41 U/L _1 ALT 17 - 63 U/L 13(L) 14 12  Alk Phosphatase 38 - 126 U/L 59 53 50  Total Bilirubin 0.3 - 1.2 mg/dL 0.5 0.4 0.7     Recent Labs  12/18/14 1419 12/20/14 0550    WBC 6.3 7.0  NEUTROABS 3.7  --   HGB 10.7* 10.1*  HCT 32.0* 30.2*  MCV 93.8 92.9  PLT 179 164    No results for input(s): TROPONINI in the last 72 hours.  Invalid input(s): CK, MB  Lab Results  Component Value Date   TSH 6.019* 12/19/2014    Recent Labs  12/19/14 0234  HGBA1C 6.7*     Recent Labs  12/18/14 1419  PROT 6.6  ALBUMIN 3.6  AST 19  ALT 13*  ALKPHOS 59  BILITOT 0.5    Recent Labs  12/18/14 1419  INR 1.05   BNP (last 3 results) No results for input(s): BNP in the last 8760 hours.  ProBNP (last 3 results) No results for input(s): PROBNP in the last 8760 hours.   Lipid Panel  Lipid Panel     Component Value Date/Time   CHOL 114 12/19/2014 0234   TRIG 66 12/19/2014 0234   HDL 40* 12/19/2014 0234   CHOLHDL 2.9 12/19/2014 0234   VLDL 13 12/19/2014 0234   LDLCALC 61 12/19/2014 0234     Imaging:  Ct Head Wo Contrast  12/20/2014   CLINICAL DATA:  81 year old male with left frontal lobe hemorrhagic infarct. Code stroke status post tPA. Initial encounter.  EXAM: CT HEAD WITHOUT CONTRAST  TECHNIQUE: Contiguous axial images were obtained from the base of the skull through the vertex without intravenous contrast.  COMPARISON:  Brain MRI 12/19/2014, head CT 12/18/2014.  FINDINGS: Visualized paranasal sinuses and mastoids are clear. Stable visualized osseous structures. Stable orbit and scalp soft tissues.  Calcified atherosclerosis at the skull base. Stable ventricle size and configuration. No midline shift. Basilar cisterns remain patent. Stable gray-white matter differentiation throughout the brain except in the left frontal lobe. 2 x 2.7 cm patchy hyperdense intra-axial hemorrhage with surrounding edema is new since 12/18/2014. Estimated hemorrhage volume 7 mL. Mild regional mass effect only. No extra-axial extension of blood.  IMPRESSION: 1. Stable left frontal lobe intra-axial hemorrhage measuring up to 2.7 cm. Mild surrounding edema/superimposed small  infarct. Mild to minimal regional mass effect is stable. 2. No new intracranial abnormality.   Electronically Signed   By: Genevie Ann M.D.   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 The son reported that the patient was able to walk but he could not speak. He also appeared to be having difficulty swallowing. No facial droop or unilateral weakness was noted. Onset of symptoms was estimated to be 6/12 1315 (LKW 1315 at 12/18/2014). EMS was summoned. The patient was able to get on the stretcher under his own power. He was brought to [facility] ED as a code stroke. A CT scan on arrival was negative for acute changes. Dr. Doy Mince examined the patient and noted an NIH score of 6. She spoke with the family regarding the risks as well as the benefits of giving TPA and a decision is made to proceed with treatment. TPA started at 1437. He was admitted to the neuro ICU for further evaluation and treatment.  EXAM: MRI HEAD WITHOUT CONTRAST  MRA HEAD WITHOUT CONTRAST  TECHNIQUE: Multiplanar, multiecho pulse sequences of the brain and surrounding structures were obtained without intravenous contrast. Angiographic images of the head were obtained using MRA technique without contrast.  COMPARISON:  CT head 12/18/2014.  FINDINGS: MRI HEAD FINDINGS  There is a large area of acute hemorrhage affecting the LEFT posterior frontal cortex and subcortical white matter, within an area of diffusion restriction, consistent with a post tPA hemorrhage into an area of cerebral infarction. The overall size of the hemorrhage is 2 x 3 cm, predominantly frontal in location but some extension towards the posterior frontal precentral motor cortex. There are no other areas of acute infarction or acute hemorrhage. No extra-axial fluid is evident. There is slight surrounding edema.  Moderate ventriculomegaly and cortical  atrophy, not unexpected for age. Mild subcortical and periventricular T2 and FLAIR hyperintensities, likely chronic microvascular ischemic change. Flow voids are maintained in the carotid, basilar, and both vertebral arteries. Calvarium intact. Extracranial soft tissues grossly unremarkable.  Compared with prior CT, there are no ischemic or hemorrhagic changes visible.  MRA HEAD FINDINGS  Internal carotid arteries are widely patent. Basilar artery widely patent with vertebrals codominant. No intracranial stenosis or aneurysm. No visible MCA branch occlusion or embolus.  IMPRESSION: 2 x 3 cm area of acute hemorrhage LEFT frontal cortex and subcortical white matter within an area of restricted diffusion consistent with post t-PA hemorrhagic transformation of acute infarction.  Generalized atrophy and small  vessel disease, not unexpected for age.  Findings discussed with Burnetta Sabin NP at time of interpretation.   Electronically Signed   By: Staci Righter M.D.   On: 12/19/2014 15:17   Dg Chest Port 1 View  12/19/2014   CLINICAL DATA:  Stroke.  EXAM: PORTABLE CHEST - 1 VIEW  COMPARISON:  12/07/2013 and CT chest 01/29/2011.  FINDINGS: Trachea is midline. Heart size stable. Biapical pleural parenchymal scarring. Mild bibasilar airspace opacification, left greater than right. No definite pleural fluid. Surgical clips are seen in both axillary regions.  IMPRESSION: Mild bibasilar airspace opacification may be due to atelectasis. Difficult to exclude pneumonia or aspiration.   Electronically Signed   By: Lorin Picket M.D.   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 The son reported that the patient was able to walk but he could not speak. He also appeared to be having difficulty swallowing. No facial droop or unilateral weakness was noted. Onset of symptoms was estimated to be 6/12  1315 (LKW 1315 at 12/18/2014). EMS was summoned. The patient was able to get on the stretcher under his own power. He was brought to [facility] ED as a code stroke. A CT scan on arrival was negative for acute changes. Dr. Doy Mince examined the patient and noted an NIH score of 6. She spoke with the family regarding the risks as well as the benefits of giving TPA and a decision is made to proceed with treatment. TPA started at 1437. He was admitted to the neuro ICU for further evaluation and treatment.  EXAM: MRI HEAD WITHOUT CONTRAST  MRA HEAD WITHOUT CONTRAST  TECHNIQUE: Multiplanar, multiecho pulse sequences of the brain and surrounding structures were obtained without intravenous contrast. Angiographic images of the head were obtained using MRA technique without contrast.  COMPARISON:  CT head 12/18/2014.  FINDINGS: MRI HEAD FINDINGS  There is a large area of acute hemorrhage affecting the LEFT posterior frontal cortex and subcortical white matter, within an area of diffusion restriction, consistent with a post tPA hemorrhage into an area of cerebral infarction. The overall size of the hemorrhage is 2 x 3 cm, predominantly frontal in location but some extension towards the posterior frontal precentral motor cortex. There are no other areas of acute infarction or acute hemorrhage. No extra-axial fluid is evident. There is slight surrounding edema.  Moderate ventriculomegaly and cortical atrophy, not unexpected for age. Mild subcortical and periventricular T2 and FLAIR hyperintensities, likely chronic microvascular ischemic change. Flow voids are maintained in the carotid, basilar, and both vertebral arteries. Calvarium intact. Extracranial soft tissues grossly unremarkable.  Compared with prior CT, there are no ischemic or hemorrhagic changes visible.  MRA HEAD FINDINGS  Internal carotid arteries are widely patent. Basilar artery widely patent with vertebrals codominant. No intracranial stenosis or aneurysm. No visible  MCA branch occlusion or embolus.  IMPRESSION: 2 x 3 cm area of acute hemorrhage LEFT frontal cortex and subcortical white matter within an area of restricted diffusion consistent with post t-PA hemorrhagic transformation of acute infarction.  Generalized atrophy and small vessel disease, not unexpected for age.  Findings discussed with Burnetta Sabin NP at time of interpretation.   Electronically Signed   By: Staci Righter M.D.   On: 12/19/2014 15:17    ------------------------------------------------------------------- 2 d ECHO Study Conclusions  - Left ventricle: The cavity size  was normal. Systolic function was normal. Wall motion was normal; there were no regional wall motion abnormalities. - Aortic valve: Moderate diffuse thickening and calcification, consistent with sclerosis. - Aorta: Ascending aortic diameter: 40 mm (S). - Ascending aorta: The ascending aorta was mildly dilated. - Mitral valve: There was mild to moderate regurgitation. - Right atrium: The atrium was severely dilated. - Tricuspid valve: There was moderate regurgitation. - Pulmonary arteries: PA peak pressure: 62 mm Hg (S).  Impressions:  - The right ventricular systolic pressure was increased consistent with moderate pulmonary hypertension.  Assessment/Plan:   Principal Problem:   Stroke Active Problems:   CAD s/p DES to RCA (2008)   Dyslipidemia   Hypertension   CKD (chronic kidney disease), stage III   Bradycardia (chronic)   History of breast cancer in male w/ mets post radiation   COPD (chronic obstructive pulmonary disease)   Aphasia   PAF (paroxysmal atrial fibrillation)   Hypothyroidism  S/P Left brain CVA s/p tPA with small 2x3 hemorrhage. SSS with brady -tachy; CVA probably secondary to PAF. With bradycardia avoid rate slowing meds presently. Defer anticoagulation until ok from neuro standpoint. Continue statin.    Troy Sine, MD, [facility] 12/20/2014, 3:50 PM

## 2014-12-20 NOTE — Progress Notes (Signed)
Physical Therapy Treatment Patient Details Name: Derek Flores MRN: [id number] DOB: 12/21/1920 Today's Date: 12/20/2014    History of Present Illness Adm 12/18/14 with speech impaired. CT head negative and administered tPA for Lt CVA. While performing PT evaluation, RN in to notify that MRI showed 2 x 3 cm area of acute hemorrhage LEFT frontal cortex. Pt returned to bed per RN request. To have follow-up CT scan 6/14 PMHx-prostate and breast Ca, arthritis with bil TKR, HOH with bil hearing aids    PT Comments    Suspect pt functioning near baseline however can not verify. Pt functioning at supervision level and had no LOB during ambulation or stair negotiation. If pt has 24/7 pt able to go home once medically cleared.  Follow Up Recommendations  Home health PT;Supervision/Assistance - 24 hour     Equipment Recommendations  None recommended by PT    Recommendations for Other Services       Precautions / Restrictions Precautions Precautions: Fall Restrictions Weight Bearing Restrictions: No    Mobility  Bed Mobility Overal bed mobility: Modified Independent             General bed mobility comments: use of bed rail  Transfers Overall transfer level: Needs assistance Equipment used: None Transfers: Sit to/from Stand Sit to Stand: Supervision         General transfer comment: pt moves quickly but no episodes of LOB  Ambulation/Gait Ambulation/Gait assistance: Min guard Ambulation Distance (Feet): 800 Feet Assistive device: None Gait Pattern/deviations: Step-through pattern;Trunk flexed   Gait velocity interpretation: at or above normal speed for age/gender General Gait Details: pt initially holding onto PTs arm but then transitioned to no AD or assist just close supervision   Stairs Stairs: Yes Stairs assistance: Supervision Stair Management: One rail Right Number of Stairs: 5 General stair comments: no difficulties noted  Wheelchair Mobility    Modified  Rankin (Stroke Patients Only) Modified Rankin (Stroke Patients Only) Pre-Morbid Rankin Score: No symptoms Modified Rankin: Moderately severe disability     Balance Overall balance assessment: Needs assistance               Single Leg Stance - Right Leg: 0.1 Single Leg Stance - Left Leg: 0.1 Tandem Stance - Right Leg: 0.05 Tandem Stance - Left Leg: 0.05       High Level Balance Comments: pt with difficulty tandem ambulating    Cognition Arousal/Alertness: Awake/alert Behavior During Therapy: WFL for tasks assessed/performed Overall Cognitive Status: Difficult to assess (no family available to verify baseline)                 General Comments: appears with it and appropriate however remains slightliy impulsive with decreased insight to safety.    Exercises      General Comments        Pertinent Vitals/Pain Pain Assessment: No/denies pain    Home Living                      Prior Function            PT Goals (current goals can now be found in the care plan section) Acute Rehab PT Goals Patient Stated Goal: go home Progress towards PT goals: Progressing toward goals    Frequency  Min 3X/week    PT Plan Current plan remains appropriate;Frequency needs to be updated    Co-evaluation             End of Session Equipment Utilized During  Treatment: Gait belt Activity Tolerance: Patient tolerated treatment well Patient left: in bed;with call bell/phone within reach;with bed alarm set;with nursing/sitter in room     Time: 1159-1212 PT Time Calculation (min) (ACUTE ONLY): 13 min  Charges:  $Gait Training: 8-22 mins                    G Codes:      [name] 12/20/2014, 12:30 PM   Kittie Plater, PT, DPT Pager #: [phone] Office #: [phone]

## 2014-12-20 NOTE — Progress Notes (Signed)
Patient discharged home with family. RN discussed discharge instructions including medications, follow up appointments with patient and family. Family vocalized understanding. Neuro assessment intact. Patient remains alert and oriented x2, MD aware. Per family, patient was not on hydrochlorothiazide, per MD, instruct patient to not take or pick up prescription sent for it. Patient escorted via wheelchair by nurse tech to family's car. Per family, patient will be staying at their home for supervision, patient instructed to not drive for one week per MD order. Tele removed, IV removed.

## 2014-12-20 NOTE — Discharge Instructions (Signed)
STROKE/TIA DISCHARGE INSTRUCTIONS SMOKING Cigarette smoking nearly doubles your risk of having a stroke & is the single most alterable risk factor  If you smoke or have smoked in the last 12 months, you are advised to quit smoking for your health.  Most of the excess cardiovascular risk related to smoking disappears within a year of stopping.  Ask you doctor about anti-smoking medications  [place] Quit Line: 1-800-QUIT NOW  Free Smoking Cessation Classes [phone]  CHOLESTEROL Know your levels; limit fat & cholesterol in your diet  Lipid Panel     Component Value Date/Time   CHOL 114 12/19/2014 0234   TRIG 66 12/19/2014 0234   HDL 40* 12/19/2014 0234   CHOLHDL 2.9 12/19/2014 0234   VLDL 13 12/19/2014 0234   LDLCALC 61 12/19/2014 0234      Many patients benefit from treatment even if their cholesterol is at goal.  Goal: Total Cholesterol (CHOL) less than 160  Goal:  Triglycerides (TRIG) less than 150  Goal:  HDL greater than 40  Goal:  LDL (LDLCALC) less than 100   BLOOD PRESSURE American Stroke Association blood pressure target is less that 120/80 mm/Hg  Your discharge blood pressure is:  BP: (!) 134/55 mmHg  Monitor your blood pressure  Limit your salt and alcohol intake  Many individuals will require more than one medication for high blood pressure  DIABETES (A1c is a blood sugar average for last 3 months) Goal HGBA1c is under 7% (HBGA1c is blood sugar average for last 3 months)  Diabetes:     Lab Results  Component Value Date   HGBA1C 6.7* 12/19/2014     Your HGBA1c can be lowered with medications, healthy diet, and exercise.  Check your blood sugar as directed by your physician  Call your physician if you experience unexplained or low blood sugars.  PHYSICAL ACTIVITY/REHABILITATION Goal is 30 minutes at least 4 days per week  Activity: No driving, Therapies: None   Activity decreases your risk of heart attack and stroke and makes your heart stronger.  It  helps control your weight and blood pressure; helps you relax and can improve your mood.  Participate in a regular exercise program.  Talk with your doctor about the best form of exercise for you (dancing, walking, swimming, cycling).  DIET/WEIGHT Goal is to maintain a healthy weight  Your discharge diet is: Diet Carb Modified Fluid consistency:: Thin; Room service appropriate?: Yes, thin liquids Your height is:  Height: 5\' 10"  (177.8 cm) Your current weight is: Weight: 64.7 kg (142 lb 10.2 oz) Your Body Mass Index (BMI) is:  BMI (Calculated): 20.5  Following the type of diet specifically designed for you will help prevent another stroke.  Your goal Body Mass Index (BMI) is 19-24.  Healthy food habits can help reduce 3 risk factors for stroke:  High cholesterol, hypertension, and excess weight.  RESOURCES Stroke/Support Group:  Call [phone]   STROKE EDUCATION PROVIDED/REVIEWED AND GIVEN TO PATIENT Stroke warning signs and symptoms How to activate emergency medical system (call 911). Medications prescribed at discharge. Need for follow-up after discharge. Personal risk factors for stroke. Pneumonia vaccine given: No Flu vaccine given: No My questions have been answered, the writing is legible, and I understand these instructions.  I will adhere to these goals & educational materials that have been provided to me after my discharge from the hospital.

## 2014-12-20 NOTE — Evaluation (Signed)
Occupational Therapy Evaluation Patient Details Name: Derek Flores MRN: [id number] DOB: 1920/09/21 Today's Date: 12/20/2014    History of Present Illness Adm 12/18/14 with speech impaired. CT head negative and administered tPA for Lt CVA. MRI showed 2 x 3 cm area of acute hemorrhage LEFT frontal cortex. Follow-up CT scan 6/14. PMH: prostate and breast cancer, arthritis with bil TKR, HOH with bil hearing aids   Clinical Impression   Pt functioning at supervision level, and is suspect to be functioning near baseline; no family present to determine baseline. Pt able to complete tub transfer with min A for balance. Pt reports having grab bars at home in shower. Pt able to remember room number, but unable to navigate to room, requiring multiple cues and redirecting. Pt reports having a son who visits, but no one who assists on a daily basis at home. Pt denies driving, but reports driving a golf cart to visit neighbors. OT recommending Pt not drive at this time due to safety and problem solving concerns. Pt would benefit from 24/7 supervision and [place] for safety with ADLs.    Follow Up Recommendations  Home health OT;Supervision/Assistance - 24 hour    Equipment Recommendations       Recommendations for Other Services       Precautions / Restrictions Precautions Precautions: Fall Restrictions Weight Bearing Restrictions: No      Mobility Bed Mobility Overal bed mobility: Modified Independent             General bed mobility comments: use of bed rail  Transfers Overall transfer level: Needs assistance Equipment used: None Transfers: Sit to/from Stand Sit to Stand: Supervision         General transfer comment: pt moves quickly but no episodes of LOB    Balance Overall balance assessment: Needs assistance Sitting-balance support: Feet supported       Standing balance support: Single extremity supported;During functional activity   Standing balance comment: Pt  maintained balance during tub transfer and reaching items in shower.     High level balance activites: Direction changes;Turns;Head turns;Sudden stops            ADL Overall ADL's : Needs assistance/impaired Eating/Feeding: Independent;Sitting   Grooming: Supervision/safety;Cueing for safety;Standing   Upper Body Bathing: Supervision/ safety;Standing   Lower Body Bathing: Supervison/ safety;Sit to/from stand   Upper Body Dressing : Independent   Lower Body Dressing: Supervision/safety;Sit to/from stand   Toilet Transfer: Supervision/safety;Ambulation;Regular Toilet   Toileting- Water quality scientist and Hygiene: Independent;Sit to/from stand   Tub/ Shower Transfer: Tub transfer;Minimal assistance;Ambulation;Grab bars   Functional mobility during ADLs: Min guard General ADL Comments: Pt impulsive, trying to d/c IV lines, required cuing to assess and sequence before completing tub transfer. Pt trying to enter wrong room after being told it is not correct room, and seeing stragner in room.       Vision Vision Assessment?: No apparent visual deficits   Perception     Praxis      Pertinent Vitals/Pain Pain Assessment: No/denies pain     Hand Dominance Right   Extremity/Trunk Assessment Upper Extremity Assessment Upper Extremity Assessment: Overall WFL for tasks assessed   Lower Extremity Assessment Lower Extremity Assessment: Overall WFL for tasks assessed       Communication Communication Communication: No difficulties;HOH   Cognition Arousal/Alertness: Awake/alert Behavior During Therapy: WFL for tasks assessed/performed;Impulsive Overall Cognitive Status: No family/caregiver present to determine baseline cognitive functioning Area of Impairment: Orientation;Safety/judgement Orientation Level: Disoriented to;Place;Situation  Safety/Judgement: Decreased awareness of safety     General Comments: Pt able to remember room number, but requires nuerous  VC's to locate correct room.  Pt unable to follow signs to navigate to correct room.   General Comments       Exercises       Shoulder Instructions      Home Living Family/patient expects to be discharged to:: Private residence Living Arrangements: Alone Available Help at Discharge: Family;Available PRN/intermittently Type of Home: House Home Access: Stairs to enter [employer] of Steps: "a few"   Home Layout: One level     Bathroom Shower/Tub: Tub/shower unit;Curtain Shower/tub characteristics: Architectural technologist: Standard     Home Equipment: Grab bars - tub/shower          Prior Functioning/Environment Level of Independence: Independent        Comments: Pt reports driving golf cart, not car    OT Diagnosis: Cognitive deficits   OT Problem List: Decreased cognition;Decreased safety awareness;Impaired balance (sitting and/or standing)   OT Treatment/Interventions: Self-care/ADL training;Therapeutic activities;Patient/family education;Therapeutic exercise    OT Goals(Current goals can be found in the care plan section) Acute Rehab OT Goals Patient Stated Goal: go home OT Goal Formulation: With patient Time For Goal Achievement: 01/03/15 Potential to Achieve Goals: Good ADL Goals Pt Will Perform Grooming: standing;with supervision Pt Will Perform Lower Body Dressing: with supervision;sit to/from stand Pt Will Transfer to Toilet: with supervision;regular height toilet  OT Frequency: Min 3X/week   Barriers to D/C: Decreased caregiver support  Pt lives alone, reports having son who is local and visits.       Co-evaluation              End of Session Equipment Utilized During Treatment: Gait belt Nurse Communication: Mobility status;Precautions  Activity Tolerance: Patient tolerated treatment well Patient left: in bed;with nursing/sitter in room;with call bell/phone within reach   Time: 1318-1340 OT Time Calculation (min): 22  min Charges:  OT General Charges $OT Visit: 1 Procedure OT Evaluation $Initial OT Evaluation Tier I: 1 Procedure G-Codes:    Derek Flores 12/20/2014, 2:35 PM

## 2014-12-20 NOTE — Discharge Summary (Addendum)
Physician Discharge Summary  Patient ID: Derek Flores MRN: [id number] DOB/AGE: 03/28/1921 81 y.o.  Admit date: 12/18/2014 Discharge date: 12/20/2014  Admission Diagnoses: Code stroke  Discharge Diagnoses: Cardioembolic left frontal MCA branch infarct from new onset atrial fibrillation treated with IV TPA with nonsymptomatic post TPA hemorrhage without neurological worsening Principal Problem:   Stroke Active Problems:   CAD s/p DES to RCA (2008)   Dyslipidemia   Hypertension   CKD (chronic kidney disease), stage III   Bradycardia (chronic)   History of breast cancer in male w/ mets post radiation (Remote history of bilateral breast cancer, diagnosis of multinode positive right-sided breast cancer in 1996, status with adjuvant CMF chemotherapy and tamoxifen. Status post bilateral mastectomies. Subsequent metastatic breast cancer involving a right axillary subcutaneous mass and mediastinal lymphadenopathy.)    COPD (chronic obstructive pulmonary disease)   Aphasia   PAF (paroxysmal atrial fibrillation)   Hypothyroidism   Cerebral hemorrhage   Discharged Condition: fair  Hospital Course: [name] is an active 81 y.o. 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 The son reported that the patient was able to walk but he could not speak. He also appeared to be having difficulty swallowing. No facial droop or unilateral weakness was noted. Onset of symptoms was estimated to be 1315 (LKW 1315 at 12/18/2014). EMS was summoned. The patient was able to get on the stretcher under his own power. He was brought to [facility] ED as a code stroke. A CT scan on arrival was negative for acute changes. Dr. Doy Mince examined the patient and noted an NIH score of 6. She spoke with the family regarding the risks as well as the benefits of giving TPA and a decision is made to proceed with treatment. TPA started at 1437. He was admitted to the neuro ICU for  further evaluation and treatment. He showed improvement in his speech and blood pressure was tightly controlled and he had close neurological follow-up.  Assessment stable but when he had MRI scan he was found to have left frontal MCA branch infarct with progressive post tPA hemorrhage. This was not felt clinically significant. He was monitored in the stroke unit for another 24 hours and follow-up CT scan of the head showed stable appearance of the hemorrhage without any increase in size. Patient became intermittently confused and was sundowning. The patient's family felt that this was because of changes in environment and wanted to take him home. Even though the patient was living by himself and the family the son lives nextdoor had close supervision and wanted to take him home. Patient had past history of breast cancer and the possibility of brain metastases was considered but the patient was getting agitated and hence a repeat MRI with contrast was not done during hospitalization but would be considered as an outpatient. This was communicated to the family. At the time of discharge the patient had only mild word finding difficulties but was present and had no further focal neurological deficits. Cardiology was consulted to help with his new onset atrial fibrillation on rate control and followed him throughout his hospitalization. Carotid ultrasound showed no significant extracranial stenosis. Transthoracic echo showed normal ejection fraction without cardiac source of embolism. Hemoglobin A1c was 6.7 and LDL cholesterol was 61 mg percent. Due to patient's post TPA hemorrhage no antiplatelets therapy was prescribed at discharge with plan to check repeat CT scan of the head in 2 weeks and start aspirin.  If the patient was felt to be a reliable candidate for anticoagulation this could be considered upon follow-up visit in the office in 2 months with . This was explained to the patient's son and he expressed  understanding  Consults: cardiology- Dr Gwenlyn Found and Hospitalist medicine- Dr Leisa Lenz Significant Diagnostic Studies: as above Treatments: iv TPA  Discharge Exam: Blood pressure 134/55, pulse 46, temperature 97.7 F (36.5 C), temperature source Oral, resp. rate 18, height 5\' 10"  (1.778 m), weight 142 lb 10.2 oz (64.7 kg), SpO2 98 %.  Frail elderly african [place] male not in distress. . Afebrile. Head is nontraumatic. Severely decreased hearing bilaterally. Neck is supple without bruit. Cardiac exam no murmur or gallop. Lungs are clear to auscultation. Distal pulses are well felt. Neurological Exam :  Awake alert oriented 2. Speech is slightly nonfluent with word finding difficulties but able to speak sentences quite clearly. Able to name and repeat and comprehend quite well. No dysarthria. Extraocular movements are full range without nystagmus. Fundi were not visualized. Vision acuity seems adequate. Face is slightly asymmetric when he smiles with right laser labial fold asymmetry. Tongue is midline. Motor system exam reveals no upper or lower extremity drift. Diminished fine finger movements on the right. Mild weakness of right grip. Orbits left over right upper extremity. Mild right lower leg weakness of hip flexors and ankle dorsiflexor 4/5 but no lower extremity drift. Deep tendon reflexes are symmetric. Touch pinprick sensation appear preserved bilaterally. Plantars are downgoing. Gait was not tested. NIHSS 2  Disposition: Final discharge disposition not confirmed  Discharge Instructions    Ambulatory referral to Neurology    Complete by:  As directed   Please schedule post stroke follow up in 2 months.     Driving Restrictions    Complete by:  As directed   We recommend no driving x 2 weeks  Repeat CT head brain in 2 weeks on 01/03/15 11 am            Medication List    STOP taking these medications        aspirin 81 MG tablet      TAKE these medications         acetaminophen 500 MG tablet  Commonly known as:  TYLENOL  Take 1,000 mg by mouth daily.     amLODipine 5 MG tablet  Commonly known as:  NORVASC  Take 5 mg by mouth at bedtime.     CRESTOR 20 MG tablet  Generic drug:  rosuvastatin  Take 20 mg by mouth at bedtime.     cyanocobalamin 1000 MCG tablet  Take 1,000 mcg by mouth daily.     diphenhydramine-acetaminophen 25-500 MG Tabs  Commonly known as:  TYLENOL PM  Take 1 tablet by mouth at bedtime as needed.     hydrochlorothiazide 25 MG tablet  Commonly known as:  HYDRODIURIL  Take 1 tablet (25 mg total) by mouth daily.     levothyroxine 25 MCG tablet  Commonly known as:  SYNTHROID, LEVOTHROID  Take 25 mcg by mouth at bedtime.     NITROSTAT 0.4 MG SL tablet  Generic drug:  nitroGLYCERIN  Place 0.4 mg under the tongue every 5 (five) minutes as needed for chest pain.     tamsulosin 0.4 MG Caps capsule  Commonly known as:  FLOMAX  Take 0.4 mg by mouth daily.           Follow-up Information    Follow up with SETHI,PRAMOD, MD In 2  months.   Specialties:  Neurology, Radiology   Why:  Stroke Clinic, Office will call you with appointment date & time   Contact information:   [place] [address] [phone]       Follow up with Olin Hauser, MD In 2 weeks.   Specialty:  Family Medicine   Why:  for hospital follow up   Contact information:   [place] [address] [phone]       Follow up with CARROLL,DONNA, NP. Schedule an appointment as soon as possible for a visit in 2 weeks.   Specialties:  Nurse Practitioner, Cardiology   Why:  for hospital follow up, atrial fibrillation management   Contact information:   [address] [place] [address] [phone]     Time spent on DC summary 35 minutes  Signed: SETHI,PRAMOD 12/20/2014, 4:29 PM

## 2014-12-20 NOTE — Clinical Documentation Improvement (Signed)
Presents with CVA; received TPA; MRA post procedure reveals acute hemorrhage with slight surrounding edema.  Please clarify if the patient's cerebral edema is clinically significant and if so please document your findings in next progress note and include in discharge summary if applicable. If this is an incidental finding, please document as such.  _______Other Condition__________________ _______Cannot Clinically Determine   Thank You, Zoila Shutter ,RN Clinical Documentation Specialist:  [facility]

## 2014-12-23 ENCOUNTER — Other Ambulatory Visit: Payer: Self-pay

## 2014-12-23 NOTE — Patient Outreach (Signed)
Outreached to patient to obtain verbal consent to receive [facility] phone calls.  Patient agreed; calls will begin 12/24/14.  [facility] will follow-up as needed per daily dashboard report for [facility] stroke series.

## 2015-01-11 ENCOUNTER — Inpatient Hospital Stay (HOSPITAL_COMMUNITY): Admission: RE | Admit: 2015-01-11 | Payer: Medicare Other | Source: Ambulatory Visit | Admitting: Nurse Practitioner

## 2015-03-29 ENCOUNTER — Other Ambulatory Visit: Payer: Self-pay

## 2015-03-29 NOTE — Patient Outreach (Signed)
Outreach attempt to obtain Modified Rankin score. No answer; HIPAA compliant voicemail left requesting callback.

## 2015-03-30 ENCOUNTER — Ambulatory Visit: Payer: Medicare Other | Admitting: Oncology

## 2015-04-20 ENCOUNTER — Other Ambulatory Visit: Payer: Self-pay | Admitting: *Deleted

## 2015-04-20 ENCOUNTER — Telehealth: Payer: Self-pay | Admitting: Oncology

## 2015-04-20 NOTE — Telephone Encounter (Signed)
s.w. pt and advised on OCT appt....pt ok adn aware °

## 2015-04-20 NOTE — Progress Notes (Signed)
Per Dr. Benay Spice, CXR reviewed and he doubts concerning spots on CXR are related to cancer. Left message with Marya Landry NP office relaying this info. POF in for pt to be seen w/ Lattie Haw or Dr. Benay Spice on 10/19 or 10/20 per [facility] instruction.

## 2015-04-27 ENCOUNTER — Telehealth: Payer: Self-pay | Admitting: Oncology

## 2015-04-27 ENCOUNTER — Ambulatory Visit (HOSPITAL_BASED_OUTPATIENT_CLINIC_OR_DEPARTMENT_OTHER): Payer: Medicare Other | Admitting: Oncology

## 2015-04-27 VITALS — BP 158/58 | HR 94 | Temp 97.6°F | Resp 18 | Ht 70.0 in | Wt 152.2 lb

## 2015-04-27 DIAGNOSIS — Z853 Personal history of malignant neoplasm of breast: Secondary | ICD-10-CM

## 2015-04-27 NOTE — Progress Notes (Signed)
  [facility] OFFICE PROGRESS NOTE   Diagnosis: Breast cancer  INTERVAL HISTORY:   Mr. Derek Flores missed the last scheduled office visit at the [facility]. He was admitted with a CVA in June. He reports developing speech difficulty with stroke. This has improved. He reports staying in a rehabilitation facility following discharge from the hospital and has now returned home. He recently saw his primary physician and reported a cough. A chest x-ray revealed worsening of patchy opacity at the right lower lobe with stable chronic interstitial changes. Mr. Derek Flores reports no change over the chest wall. He denies dyspnea today.  Objective:  Vital signs in last 24 hours:  Blood pressure 158/58, pulse 94, temperature 97.6 F (36.4 C), temperature source Oral, resp. rate 18, height 5\' 10"  (1.778 m), weight 152 lb 3.2 oz (69.037 kg), SpO2 94 %.    HEENT: Neck without mass Lymphatics: No cervical, supraclavicular, or axillary nodes Resp: Scattered inspiratory rhonchi throughout the posterior chest, no respiratory distress Cardio: Regular rate and rhythm GI: No hepatomegaly Vascular: 1+ pitting edema at the lower leg bilaterally Neuro: Alert, fluid speech  Breast: Status post bilateral mastectomy. No chest wall nodules. Radiation telangiectasias at the right lateral chest     Lab Results: Labs from 04/18/2015-hematoma 10.6, MCV 88.9, creatinine 1.53, BUN 24  Medications: I have reviewed the patient's current medications.  Assessment/Plan: 1. Metastatic breast cancer involving a right axillary subcutaneous mass and mediastinal lymphadenopathy. A fine-needle aspiration of the mass was consistent with metastatic breast cancer. The tumor was ER/PR positive. A staging PET scan revealed hypermetabolic lymph nodes in the right axilla and mediastinum.  -Arimidex was initiated after an office visit 12/11/2008.  -A. restaging CT on 06/19/2010 revealed a slight decrease the right  axillary lymphadenopathy compared to the PET scan from June of 2000 and and no evidence for progressive metastatic disease.  -The right-sided mass appeared larger with satellite lesions on exam 10/05/2010. Megace was initiated on 10/05/2010.  -A. restaging CT 01/29/2011 showed stable right axillary lymphadenopathy -he began palliative radiation to the right axillary line mass on 07/25/2011, completed 08/26/2011. The right axillary mass has resolved.  2. Remote history of bilateral breast cancer, diagnosis of multinode positive right-sided breast cancer in 1996, status with adjuvant CMF chemotherapy and tamoxifen. Status post bilateral mastectomies.  3. History of coronary artery  4. Hypertension  5. Hyperlipidemia  6. history of mild anemia  7. Chronic renal insufficiency  8. Lipoma of the left arm  9. History of arthralgias secondary to degenerative arthritis  10. Chronic bradycardia  11. Left leg pain. A plain x-ray evaluation of the lumbosacral spine left leg was negative for evidence of metastatic disease in March of 2012. A bone scan was also negative. The pain resolved.  12. Status post right knee replacement 2013 , left knee replacement 2014  13. Left MCA CVA secondary to fibrillation June 2016-maintained on eliquis     Disposition:  Derek Flores has a history of metastatic breast cancer. He remains off of specific therapy for breast cancer. There is no clear evidence of disease progression. He has chronic exertional dyspnea. He has not undergone a restaging chest CT in the past 4 years. I will refer him for a noncontrast CT of the chest within the next one to 2 weeks. He will return for an office visit here in 3 months. We will see him sooner pending the CT findings.  Betsy Coder, MD  04/27/2015  9:38 AM

## 2015-04-27 NOTE — Telephone Encounter (Signed)
GAVE ADN PRINTED APPT SCHED AND AVS FOR PTF OR jAN 2017

## 2015-05-04 ENCOUNTER — Ambulatory Visit (INDEPENDENT_AMBULATORY_CARE_PROVIDER_SITE_OTHER): Payer: Medicare Other

## 2015-05-04 DIAGNOSIS — Z9013 Acquired absence of bilateral breasts and nipples: Secondary | ICD-10-CM

## 2015-05-04 DIAGNOSIS — Z853 Personal history of malignant neoplasm of breast: Secondary | ICD-10-CM

## 2015-05-04 DIAGNOSIS — R911 Solitary pulmonary nodule: Secondary | ICD-10-CM

## 2015-05-05 ENCOUNTER — Telehealth: Payer: Self-pay | Admitting: *Deleted

## 2015-05-05 NOTE — Telephone Encounter (Signed)
Per Dr. Benay Spice; notified pt that chest CT negative for cancer.  Pt verbalized understanding and confirmed appt for January 2017

## 2015-05-05 NOTE — Telephone Encounter (Signed)
-----   Message from Ladell Pier, MD sent at 05/04/2015  8:48 PM EDT ----- Please call patient, chest CT negative for cancer, f/u as scheduled

## 2015-07-27 ENCOUNTER — Ambulatory Visit: Payer: Medicare Other | Admitting: Oncology

## 2015-11-06 DEATH — deceased

## 2016-06-29 IMAGING — CR DG CHEST 1V PORT
1 series · 1 of 1 positions shown · non-contrast
Comparison: 12/07/2013 and CT chest 01/29/2011.

CLINICAL DATA: Stroke.

EXAM:
PORTABLE CHEST - 1 VIEW

[AP]
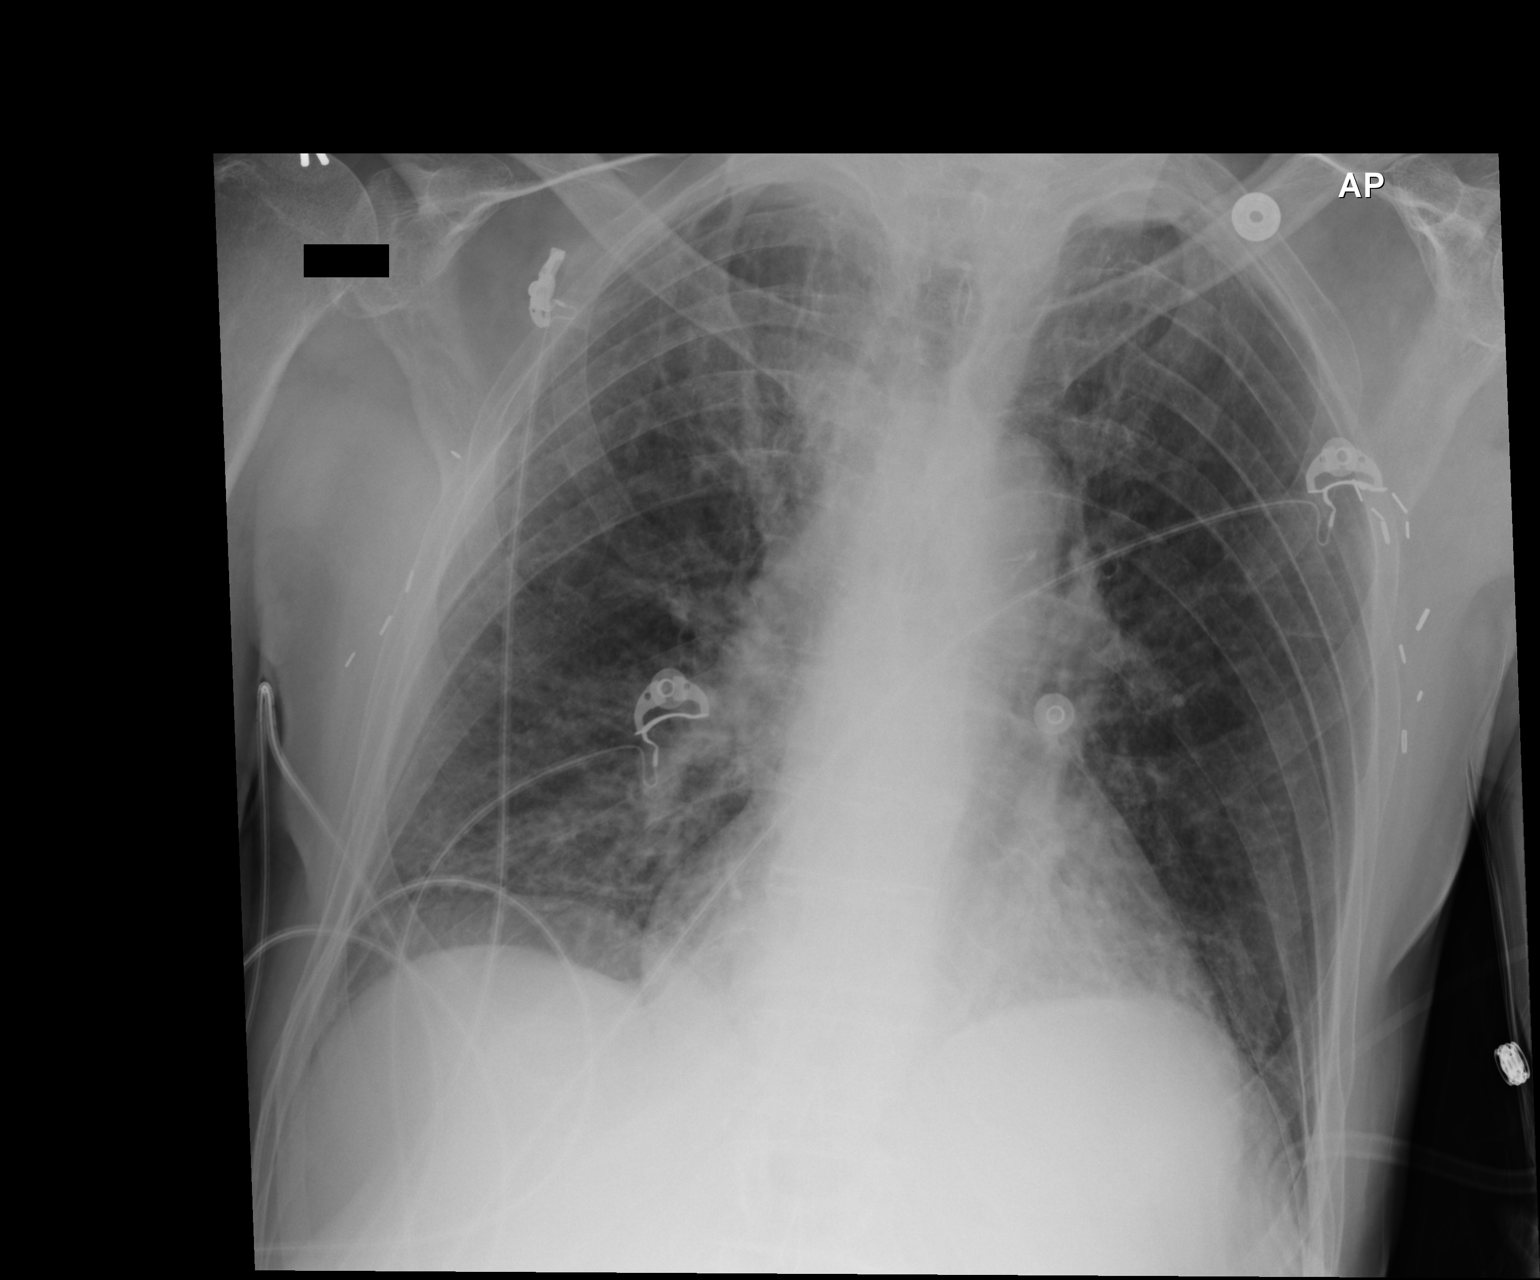

[1 of 1 positions shown; findings below may reference images not displayed]

FINDINGS: Trachea is midline. Heart size stable. Biapical pleural parenchymal
scarring. Mild bibasilar airspace opacification, left greater than
right. No definite pleural fluid. Surgical clips are seen in both
axillary regions.
IMPRESSION: Mild bibasilar airspace opacification may be due to atelectasis.
Difficult to exclude pneumonia or aspiration.

## 2016-06-30 IMAGING — CT CT HEAD W/O CM
2 series · 15 of 30 positions shown, 19 images · non-contrast
Comparison: Brain MRI 12/19/2014, head CT 12/18/2014.

CLINICAL DATA: [AGE] male with left frontal lobe hemorrhagic
infarct. Code stroke status post tPA. Initial encounter.

EXAM:
CT HEAD WITHOUT CONTRAST
TECHNIQUE: Contiguous axial images were obtained from the base of the skull
through the vertex without intravenous contrast.

[Series 201: head w/o, idose (1) · axial · non-contrast · 0.49mm/px · z∈[+40,+170]mm · 13 of 32 slices shown, 17 images]
[im 3/32  brain]
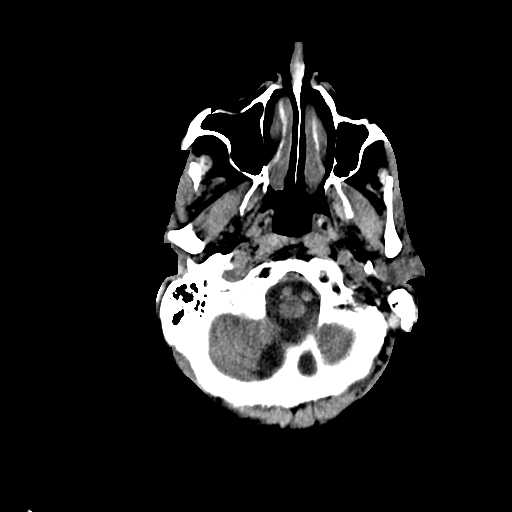
[im 3/32  bone]
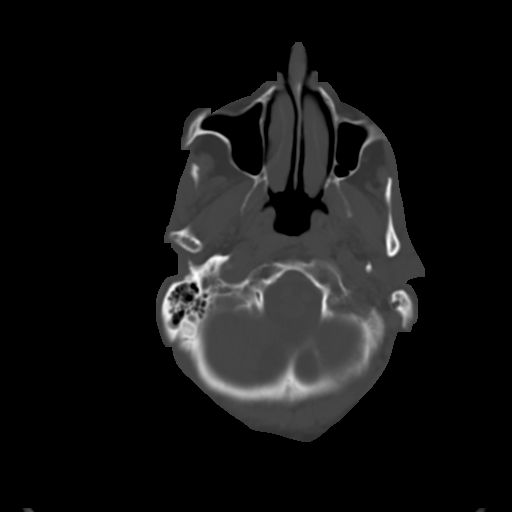
[im 5/32  brain]
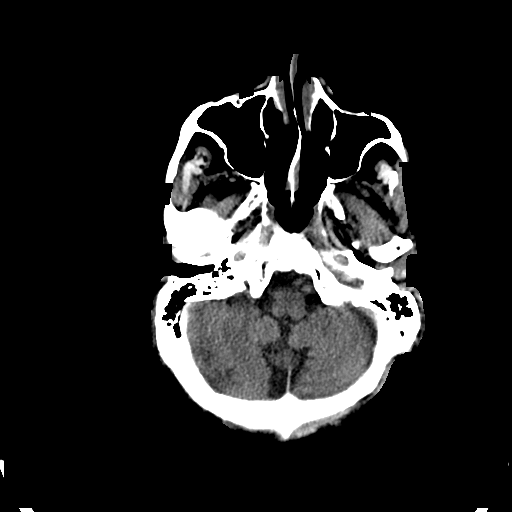
[im 7/32  brain]
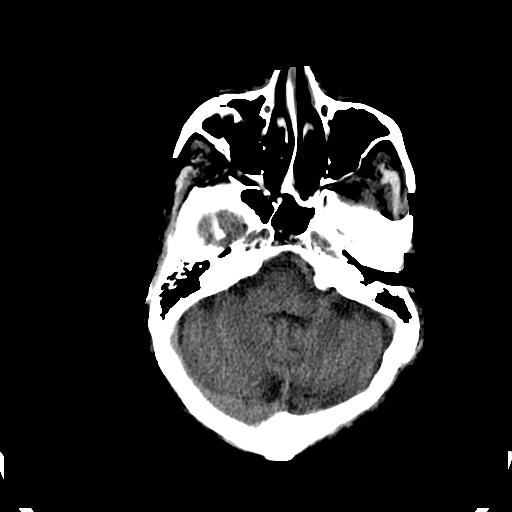
[im 9/32  brain]
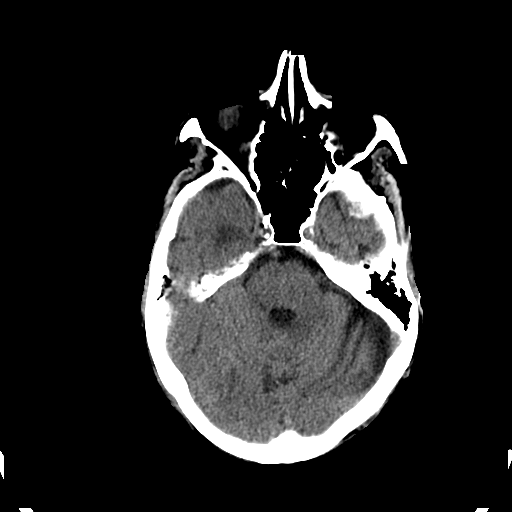
[im 12/32  brain]
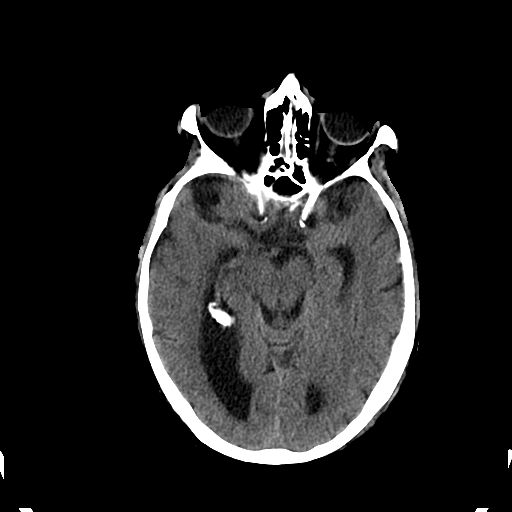
[im 12/32  bone]
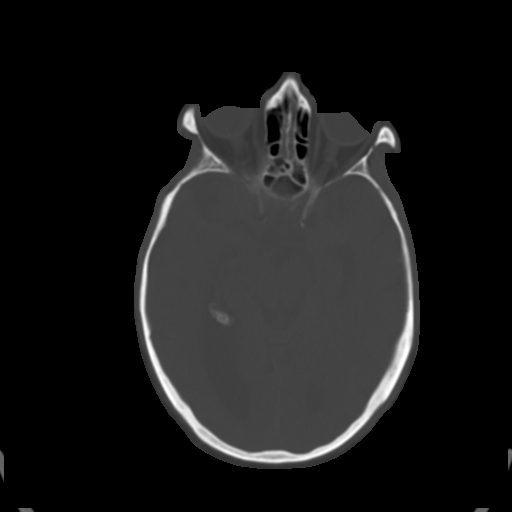
[im 14/32  brain]
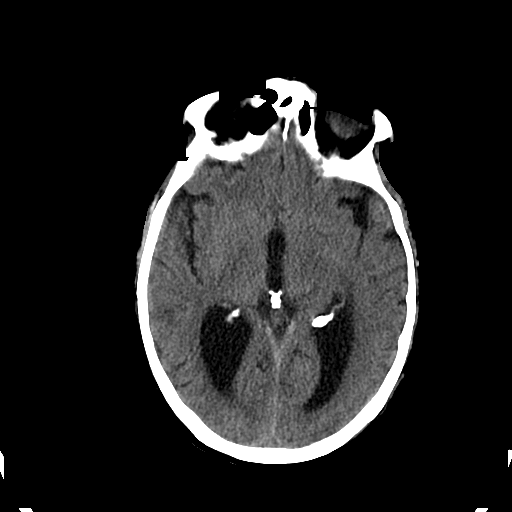
[im 16/32  brain]
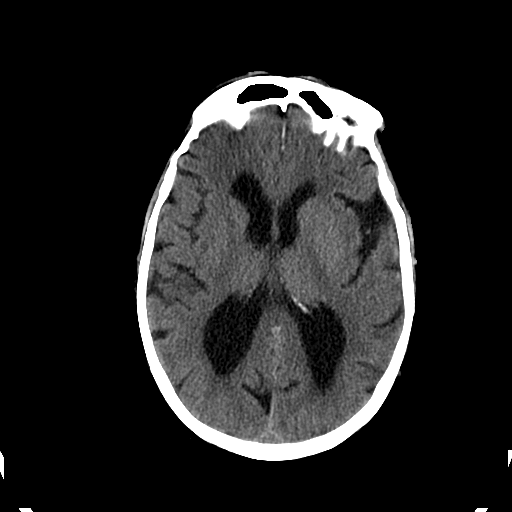
[im 18/32  brain]
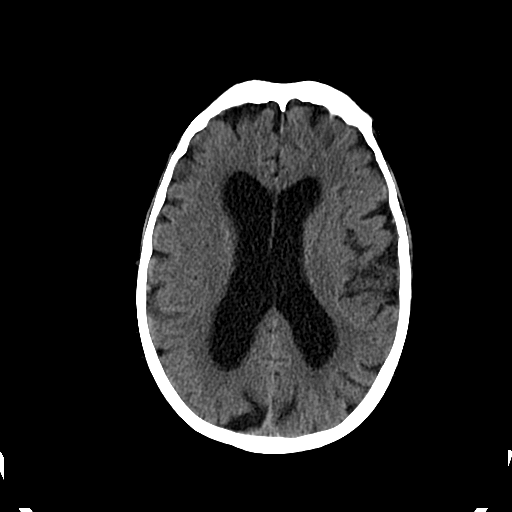
[im 20/32  brain]
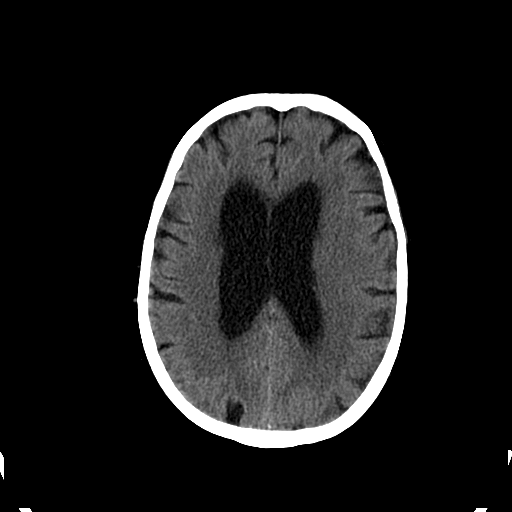
[im 20/32  bone]
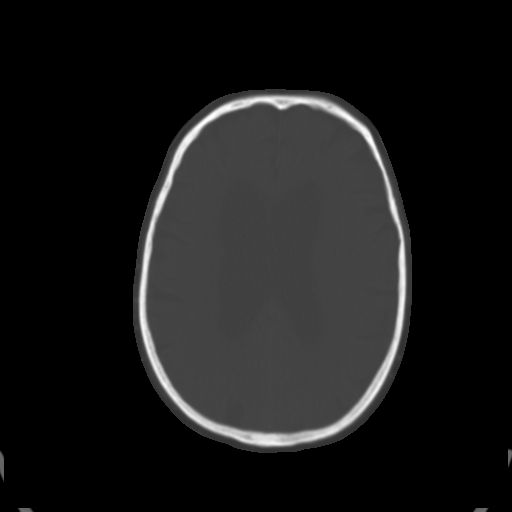
[im 23/32  brain]
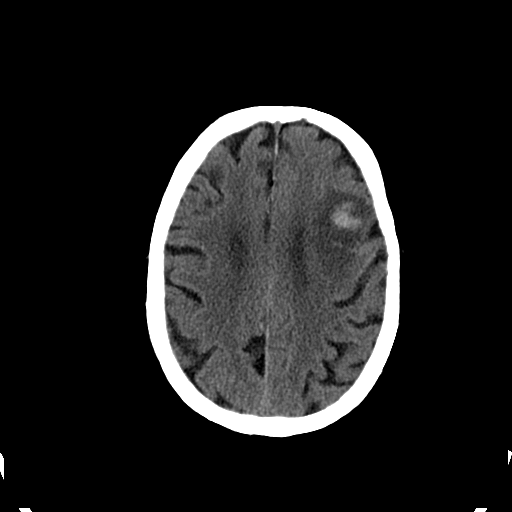
[im 25/32  brain]
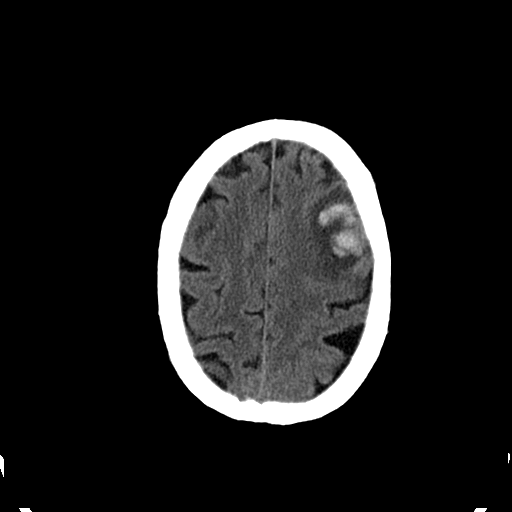
[im 27/32  brain]
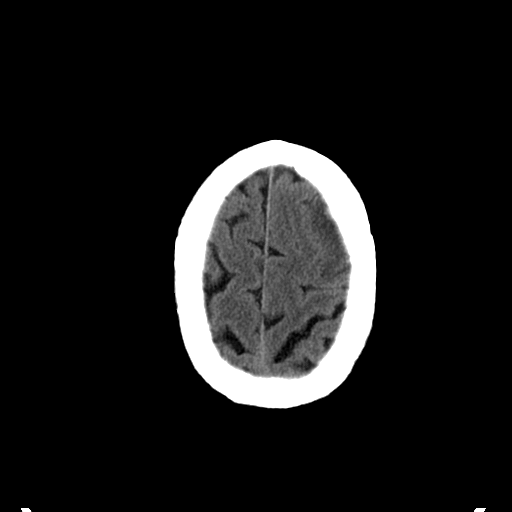
[im 29/32  brain]
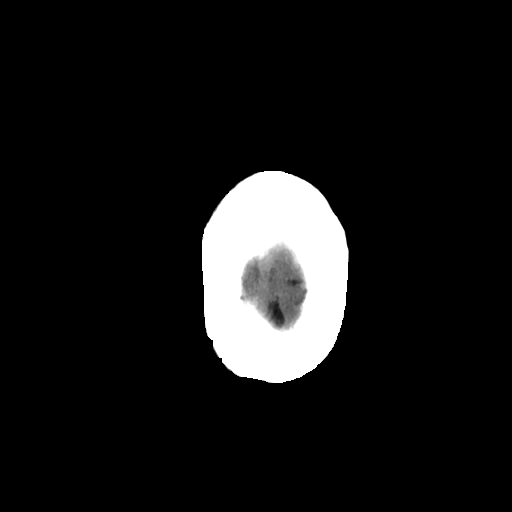
[im 29/32  bone]
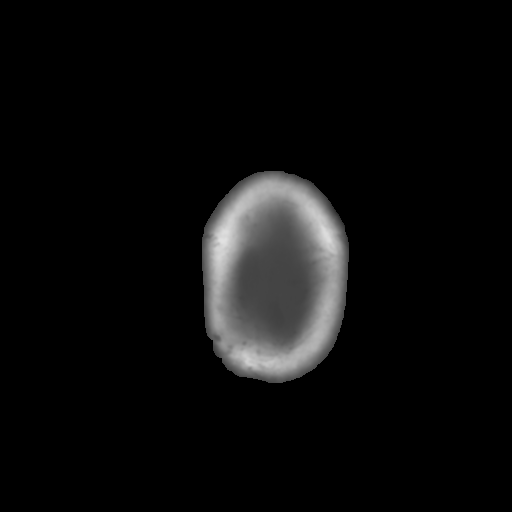

[Series 202: head w/o bone, idose (1) · axial · non-contrast · 0.49mm/px · z∈[+40,+60]mm · 2 of 32 slices shown]
[im 3/32  bone]
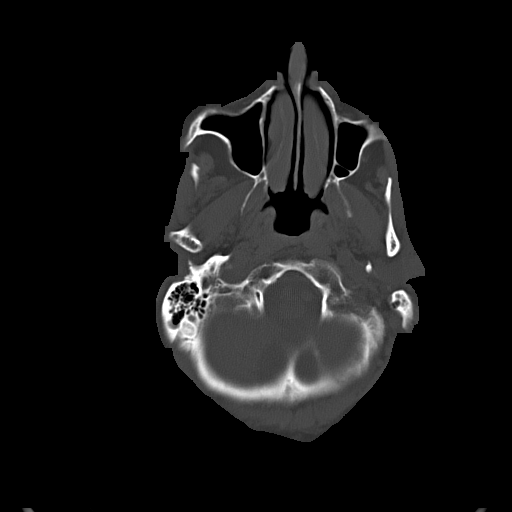
[im 7/32  bone]
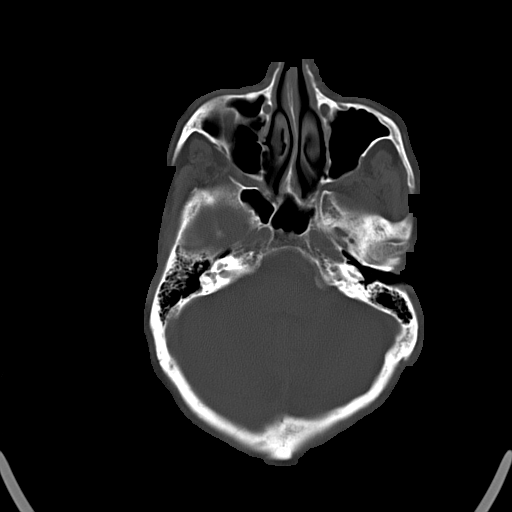

[15 of 30 positions shown; findings below may reference images not displayed]

FINDINGS: Visualized paranasal sinuses and mastoids are clear. Stable
visualized osseous structures. Stable orbit and scalp soft tissues.

Calcified atherosclerosis at the skull base. Stable ventricle size
and configuration. No midline shift. Basilar cisterns remain patent.
Stable gray-white matter differentiation throughout the brain except
in the left frontal lobe. 2 x 2.7 cm patchy hyperdense intra-axial
hemorrhage with surrounding edema is new since 12/18/2014. Estimated
hemorrhage volume 7 mL. Mild regional mass effect only. No
extra-axial extension of blood.
IMPRESSION: 1. Stable left frontal lobe intra-axial hemorrhage measuring up to
2.7 cm. Mild surrounding edema/superimposed small infarct. Mild to
minimal regional mass effect is stable.
2. No new intracranial abnormality.
